# Patient Record
Sex: Male | Born: 1995
Health system: Southern US, Community
[De-identification: ages and names within clinical notes are randomized; demographics above are authoritative.]

## PROBLEM LIST (undated history)

## (undated) DIAGNOSIS — J45909 Unspecified asthma, uncomplicated: Secondary | ICD-10-CM

## (undated) HISTORY — DX: Unspecified asthma, uncomplicated: J45.909

## (undated) HISTORY — PX: TONSILLECTOMY: SUR1361

---

## 2017-09-26 ENCOUNTER — Other Ambulatory Visit: Payer: Self-pay

## 2017-09-26 ENCOUNTER — Encounter (HOSPITAL_BASED_OUTPATIENT_CLINIC_OR_DEPARTMENT_OTHER): Payer: Self-pay | Admitting: *Deleted

## 2017-09-26 ENCOUNTER — Emergency Department (HOSPITAL_BASED_OUTPATIENT_CLINIC_OR_DEPARTMENT_OTHER): Payer: Self-pay

## 2017-09-26 DIAGNOSIS — M545 Low back pain: Secondary | ICD-10-CM | POA: Insufficient documentation

## 2017-09-26 DIAGNOSIS — R05 Cough: Secondary | ICD-10-CM | POA: Insufficient documentation

## 2017-09-26 NOTE — ED Triage Notes (Signed)
Cough x 3 days

## 2017-09-27 ENCOUNTER — Emergency Department (HOSPITAL_BASED_OUTPATIENT_CLINIC_OR_DEPARTMENT_OTHER)
Admission: EM | Admit: 2017-09-27 | Discharge: 2017-09-27 | Disposition: A | Discharge status: Home / Self Care | Payer: Self-pay | Attending: Emergency Medicine | Admitting: Emergency Medicine

## 2017-09-27 DIAGNOSIS — R059 Cough, unspecified: Secondary | ICD-10-CM

## 2017-09-27 DIAGNOSIS — R05 Cough: Secondary | ICD-10-CM

## 2017-09-27 NOTE — ED Provider Notes (Signed)
[facility] EMERGENCY DEPARTMENT Provider Note   CSN: [id number] Arrival date & time: 09/26/17  2157     History   Chief Complaint Chief Complaint  Patient presents with  . Cough    HPI Neil Mccarty is a 22 y.o. male.  The history is provided by the patient.  Cough  This is a new problem. The current episode started 2 days ago. The problem occurs hourly. The problem has not changed since onset.The cough is non-productive. There has been no fever. Pertinent negatives include no shortness of breath. He has tried nothing for the symptoms.  Patient reports of the past 2 days has been having cough. No fever, no vomiting, no shortness of breath reported He reports due to the cough is not having bilateral back pain and chest soreness He has no other complaints  PMH-none Past Surgical History:  Procedure Laterality Date  . TONSILLECTOMY         Home Medications    Prior to Admission medications   Not on File    Family History No family history on file.  Social History Social History   Tobacco Use  . Smoking status: Never Smoker  . Smokeless tobacco: Never Used  Substance Use Topics  . Alcohol use: No    Frequency: Never  . Drug use: No     Allergies   Patient has no known allergies.   Review of Systems Review of Systems  Constitutional: Negative for fever.  Respiratory: Positive for cough. Negative for shortness of breath.   Gastrointestinal: Negative for vomiting.  Musculoskeletal: Positive for back pain.     Physical Exam Updated Vital Signs BP 129/68 (BP Location: Right Arm)   Pulse 84   Temp 98.3 F (36.8 C) (Oral)   Resp 17   Ht 1.803 m (5\' 11" )   Wt 107 kg (236 lb)   SpO2 99%   BMI 32.92 kg/m   Physical Exam  CONSTITUTIONAL: Well developed/well nourished, patient sleeping on arrival to room HEAD: Normocephalic/atraumatic EYES: EOMI/PERRL ENMT: Mucous membranes moist NECK: supple no meningeal  signs SPINE/BACK:entire spine nontender Mild tenderness noted to thoracic paraspinal region CV: S1/S2 noted, no murmurs/rubs/gallops noted LUNGS: Lungs are clear to auscultation bilaterally, no apparent distress ABDOMEN: soft NEURO: Pt is awake/alert/appropriate, moves all extremitiesx4.  No facial droop.   EXTREMITIES: full ROM SKIN: warm, color normal PSYCH: no abnormalities of mood noted, alert and oriented to situation  ED Treatments / Results  Labs (all labs ordered are listed, but only abnormal results are displayed) Labs Reviewed - No data to display  EKG  EKG Interpretation None       Radiology Dg Chest 2 View  Result Date: 09/26/2017 CLINICAL DATA:  Cough, left chest and back pain EXAM: CHEST  2 VIEW COMPARISON:  None. FINDINGS: Heart and mediastinal contours are within normal limits. No focal opacities or effusions. No acute bony abnormality. IMPRESSION: No active cardiopulmonary disease. Electronically Signed   By: Charlett Nose M.D.   On: 09/26/2017 22:23    Procedures Procedures (including critical care time)  Medications Ordered in ED Medications - No data to display   Initial Impression / Assessment and Plan / ED Course  I have reviewed the triage vital signs and the nursing notes.  Pertinent imaging results that were available during my care of the patient were reviewed by me and considered in my medical decision making (see chart for details).      vitals appropriate, no hypoxia.  When I arrived to the room he was sleeping without any difficulty Chest x-ray is negative Suspect his pain is due to coughing. Discharge home Final Clinical Impressions(s) / ED Diagnoses   Final diagnoses:  Cough    ED Discharge Orders    None       Zadie RhineWickline, Keala Drum, MD 09/27/17 [phone]

## 2019-01-25 ENCOUNTER — Other Ambulatory Visit: Payer: Self-pay

## 2019-01-25 ENCOUNTER — Encounter (HOSPITAL_BASED_OUTPATIENT_CLINIC_OR_DEPARTMENT_OTHER): Payer: Self-pay

## 2019-01-25 ENCOUNTER — Emergency Department (HOSPITAL_BASED_OUTPATIENT_CLINIC_OR_DEPARTMENT_OTHER)
Admission: EM | Admit: 2019-01-25 | Discharge: 2019-01-25 | Disposition: A | Discharge status: Home / Self Care | Payer: Medicaid Other | Attending: Emergency Medicine | Admitting: Emergency Medicine

## 2019-01-25 DIAGNOSIS — K0889 Other specified disorders of teeth and supporting structures: Secondary | ICD-10-CM | POA: Insufficient documentation

## 2019-01-25 MED ORDER — AMOXICILLIN 500 MG PO CAPS: 500.0000 mg | ORAL_CAPSULE | Freq: Three times a day (TID) | ORAL | 0 refills | Status: DC

## 2019-01-25 MED ORDER — NAPROXEN 375 MG PO TABS: 375.0000 mg | ORAL_TABLET | Freq: Two times a day (BID) | ORAL | 0 refills | Status: DC

## 2019-01-25 MED FILL — AMOXICILLIN 500 MG CAPSULE: 500 | 7 days supply | Qty: 21 | Fill #0

## 2019-01-25 MED FILL — NAPROXEN 375 MG TABLET: 375 | 10 days supply | Qty: 20 | Fill #0

## 2019-01-25 NOTE — ED Provider Notes (Signed)
[facility] EMERGENCY DEPARTMENT Provider Note   CSN: [id number] Arrival date & time: 01/25/19  1507    History   Chief Complaint Chief Complaint  Patient presents with  . Dental Pain    HPI Neil Mccarty is a 23 y.o. male.     Patient presents with right lower tooth pain and gum swelling onset three days ago. No fevers or chills. No difficulty swallowing.  The history is provided by the patient. No language interpreter was used.  Dental Pain  Location:  Lower Lower teeth location:  32/RL 3rd molar Quality:  Aching and throbbing Onset quality:  Gradual Duration:  3 days Timing:  Constant Chronicity:  New Ineffective treatments:  None tried Associated symptoms: gum swelling   Associated symptoms: no difficulty swallowing, no facial swelling, no fever and no trismus     History reviewed. No pertinent past medical history.  There are no active problems to display for this patient.   Past Surgical History:  Procedure Laterality Date  . TONSILLECTOMY          Home Medications    Prior to Admission medications   Not on File    Family History No family history on file.  Social History Social History   Tobacco Use  . Smoking status: Never Smoker  . Smokeless tobacco: Never Used  Substance Use Topics  . Alcohol use: No    Frequency: Never  . Drug use: No     Allergies   Patient has no known allergies.   Review of Systems Review of Systems  Constitutional: Negative for fever.  HENT: Positive for dental problem. Negative for facial swelling.   All other systems reviewed and are negative.    Physical Exam Updated Vital Signs BP 126/66 (BP Location: Right Arm)   Pulse 64   Temp 98.5 F (36.9 C) (Oral)   Resp 20   Ht 5\' 11"  (1.803 m)   Wt 113.4 kg   SpO2 97%   BMI 34.87 kg/m   Physical Exam Vitals signs and nursing note reviewed.  Constitutional:      Appearance: Normal appearance.  HENT:     Head: Atraumatic.    Jaw: No trismus.     Mouth/Throat:     Mouth: Mucous membranes are moist.      Comments: Erupting wisdom tooth.  Eyes:     Conjunctiva/sclera: Conjunctivae normal.  Neck:     Musculoskeletal: Normal range of motion.  Cardiovascular:     Rate and Rhythm: Normal rate and regular rhythm.  Pulmonary:     Effort: Pulmonary effort is normal.     Breath sounds: Normal breath sounds.  Skin:    General: Skin is warm and dry.  Neurological:     Mental Status: He is alert and oriented to person, place, and time.  Psychiatric:        Mood and Affect: Mood normal.      ED Treatments / Results  Labs (all labs ordered are listed, but only abnormal results are displayed) Labs Reviewed - No data to display  EKG None  Radiology No results found.  Procedures Procedures (including critical care time)  Medications Ordered in ED Medications - No data to display   Initial Impression / Assessment and Plan / ED Course  I have reviewed the triage vital signs and the nursing notes.  Pertinent labs & imaging results that were available during my care of the patient were reviewed by me and considered in  my medical decision making (see chart for details).        Patient with dentalgia.  No abscess requiring immediate incision and drainage.  Exam not concerning for Ludwig's angina or pharyngeal abscess.  Will treat with NSAID and amoxicillin. Pt instructed to follow-up with dentist.  Discussed return precautions. Pt safe for discharge.  Final Clinical Impressions(s) / ED Diagnoses   Final diagnoses:  Pain, dental    ED Discharge Orders         Ordered    naproxen (NAPROSYN) 375 MG tablet  2 times daily     01/25/19 1612    amoxicillin (AMOXIL) 500 MG capsule  3 times daily     01/25/19 1612           Felicie MornSmith, Raad Clayson, NP 01/25/19 1617    Vanetta MuldersZackowski, Scott, MD 01/30/19 [phone]

## 2019-01-25 NOTE — ED Triage Notes (Signed)
Pt c/o "abscess" to R side of mouth. Pt states the area has been hurting for a couple of days. Denies fever.

## 2019-04-08 ENCOUNTER — Encounter (HOSPITAL_BASED_OUTPATIENT_CLINIC_OR_DEPARTMENT_OTHER): Payer: Self-pay | Admitting: Emergency Medicine

## 2019-04-08 ENCOUNTER — Emergency Department (HOSPITAL_BASED_OUTPATIENT_CLINIC_OR_DEPARTMENT_OTHER)
Admission: EM | Admit: 2019-04-08 | Discharge: 2019-04-08 | Disposition: A | Discharge status: Home / Self Care | Payer: Medicaid Other | Attending: Emergency Medicine | Admitting: Emergency Medicine

## 2019-04-08 ENCOUNTER — Other Ambulatory Visit: Payer: Self-pay

## 2019-04-08 DIAGNOSIS — Z79899 Other long term (current) drug therapy: Secondary | ICD-10-CM | POA: Insufficient documentation

## 2019-04-08 DIAGNOSIS — K047 Periapical abscess without sinus: Secondary | ICD-10-CM | POA: Insufficient documentation

## 2019-04-08 DIAGNOSIS — K0889 Other specified disorders of teeth and supporting structures: Secondary | ICD-10-CM

## 2019-04-08 MED ORDER — ACETAMINOPHEN ER 650 MG PO TBCR: 650.0000 mg | EXTENDED_RELEASE_TABLET | Freq: Three times a day (TID) | ORAL | 0 refills | Status: DC | PRN

## 2019-04-08 MED ORDER — CLINDAMYCIN HCL 150 MG PO CAPS: 300.0000 mg | ORAL_CAPSULE | Freq: Three times a day (TID) | ORAL | 0 refills | Status: AC

## 2019-04-08 MED ORDER — CLINDAMYCIN HCL 150 MG PO CAPS: 300.0000 mg | ORAL_CAPSULE | Freq: Three times a day (TID) | ORAL | 0 refills | Status: DC

## 2019-04-08 MED ORDER — ACETAMINOPHEN ER 650 MG PO TBCR: 650.0000 mg | EXTENDED_RELEASE_TABLET | Freq: Three times a day (TID) | ORAL | 0 refills | Status: AC | PRN

## 2019-04-08 NOTE — ED Provider Notes (Signed)
[place] EMERGENCY DEPARTMENT Provider Note   CSN: [id number] Arrival date & time: 04/08/19  1227    History   Chief Complaint Chief Complaint  Patient presents with  . Dental Pain    HPI Neil Mccarty is a 23 y.o. male.     23 y.o male with no PMH presents to the ED with a chief complaint of dental pain times yesterday.  Patient reports he was seen here in June for a dental abscess, received antibiotics which improved his condition.  Reports he now feels an abscess forming on the back of his left gumline, where his wisdom teeth need to be extracted.  Patient reports he has not set up an appointment with a dentist, states he is trying to schedule this for September.  He has not tried any medication for relieving symptoms.  He denies fevers, difficulty swallowing, difficulty with mastication.  The history is provided by the patient.  Dental Pain Associated symptoms: no fever       Past Surgical History:  Procedure Laterality Date  . TONSILLECTOMY          Home Medications    Prior to Admission medications   Medication Sig Start Date End Date Taking? Authorizing Provider  acetaminophen (TYLENOL 8 HOUR) 650 MG CR tablet Take 1 tablet (650 mg total) by mouth every 8 (eight) hours as needed for up to 7 days for pain. 04/08/19 04/15/19  Janeece Fitting, PA-C  amoxicillin (AMOXIL) 500 MG capsule Take 1 capsule (500 mg total) by mouth 3 (three) times daily. 01/25/19   Etta Quill, NP  clindamycin (CLEOCIN) 150 MG capsule Take 2 capsules (300 mg total) by mouth 3 (three) times daily for 7 days. 04/08/19 04/15/19  Janeece Fitting, PA-C  naproxen (NAPROSYN) 375 MG tablet Take 1 tablet (375 mg total) by mouth 2 (two) times daily. 01/25/19   Etta Quill, NP    Family History No family history on file.  Social History Social History   Tobacco Use  . Smoking status: Never Smoker  . Smokeless tobacco: Never Used  Substance Use Topics  . Alcohol use: No    Frequency:  Never  . Drug use: No     Allergies   Patient has no known allergies.   Review of Systems Review of Systems  Constitutional: Negative for fever.  HENT: Positive for dental problem.      Physical Exam Updated Vital Signs BP 128/67 (BP Location: Left Arm)   Pulse (!) 59   Temp 98.9 F (37.2 C) (Oral)   Resp 18   Ht 5\' 11"  (1.803 m)   Wt 113.4 kg   SpO2 99%   BMI 34.87 kg/m   Physical Exam Vitals signs and nursing note reviewed.  Constitutional:      Appearance: He is well-developed.  HENT:     Head: Normocephalic and atraumatic.     Mouth/Throat:     Mouth: Mucous membranes are moist. Mucous membranes are pale.     Pharynx: Oropharynx is clear. Uvula midline. No oropharyngeal exudate or posterior oropharyngeal erythema.   Eyes:     General: No scleral icterus.    Pupils: Pupils are equal, round, and reactive to light.  Neck:     Musculoskeletal: Normal range of motion.  Cardiovascular:     Heart sounds: Normal heart sounds.  Pulmonary:     Effort: Pulmonary effort is normal.     Breath sounds: Normal breath sounds. No wheezing.  Chest:  Chest wall: No tenderness.  Abdominal:     General: Bowel sounds are normal. There is no distension.     Palpations: Abdomen is soft.     Tenderness: There is no abdominal tenderness.  Musculoskeletal:        General: No tenderness or deformity.  Skin:    General: Skin is warm and dry.  Neurological:     Mental Status: He is alert and oriented to person, place, and time.      ED Treatments / Results  Labs (all labs ordered are listed, but only abnormal results are displayed) Labs Reviewed - No data to display  EKG None  Radiology No results found.  Procedures Procedures (including critical care time)  Medications Ordered in ED Medications - No data to display   Initial Impression / Assessment and Plan / ED Course  I have reviewed the triage vital signs and the nursing notes.  Pertinent labs &  imaging results that were available during my care of the patient were reviewed by me and considered in my medical decision making (see chart for details).   Patient with no PMH presents to the ED with a complaint of dental abscess x 1 day.  Patient had a similar episode in June, was placed on antibiotics along with pain medication which resolved the issue.  Patient reports he is aware he will need to have his wisdom teeth extracted, states he has not made an appointment with dental follow-up.  Upon evaluation there is erythema surrounding the back of his wisdom teeth region, small abscesses noted to the area, no uvula swelling, uvula is midline, no exudates, no difficulty swallowing.  Patient is nontoxic-appearing, will discharge patient on a second course of antibiotics along with pain medication.  He is aware he will do any is to follow-up with dentist in order to help with some teeth removed.  He reports he will be scheduling an appointment in September.  With otherwise stable vital signs, afebrile patient stable for discharge.  Portions of this note were generated with Scientist, clinical (histocompatibility and immunogenetics)Dragon dictation software. Dictation errors may occur despite best attempts at proofreading.    Final Clinical Impressions(s) / ED Diagnoses   Final diagnoses:  Pain, dental  Dental abscess    ED Discharge Orders         Ordered    clindamycin (CLEOCIN) 150 MG capsule  3 times daily     04/08/19 1411    acetaminophen (TYLENOL 8 HOUR) 650 MG CR tablet  Every 8 hours PRN     04/08/19 1412           Claude MangesSoto, Finnian Husted, PA-C 04/08/19 1418    Palumbo, April, MD 04/08/19 1419

## 2019-04-08 NOTE — ED Triage Notes (Signed)
L lower dental pain x 4 days.

## 2019-04-08 NOTE — Discharge Instructions (Addendum)
I have prescribed antibiotics to treat your dental infection, you will need to complete the course of antibiotics and setup and appointment with a dentist.   Dental resources are also attached to your chart, please schedule an appointment.

## 2019-04-09 MED FILL — CLINDAMYCIN HCL 150 MG CAPS: 150 | 7 days supply | Qty: 42 | Fill #0

## 2019-04-13 ENCOUNTER — Emergency Department (HOSPITAL_BASED_OUTPATIENT_CLINIC_OR_DEPARTMENT_OTHER)
Admission: EM | Admit: 2019-04-13 | Discharge: 2019-04-13 | Disposition: A | Discharge status: Home / Self Care | Payer: Medicaid Other | Attending: Emergency Medicine | Admitting: Emergency Medicine

## 2019-04-13 ENCOUNTER — Encounter (HOSPITAL_BASED_OUTPATIENT_CLINIC_OR_DEPARTMENT_OTHER): Payer: Self-pay

## 2019-04-13 ENCOUNTER — Other Ambulatory Visit: Payer: Self-pay

## 2019-04-13 DIAGNOSIS — K122 Cellulitis and abscess of mouth: Secondary | ICD-10-CM | POA: Insufficient documentation

## 2019-04-13 DIAGNOSIS — Z79899 Other long term (current) drug therapy: Secondary | ICD-10-CM | POA: Insufficient documentation

## 2019-04-13 DIAGNOSIS — K121 Other forms of stomatitis: Secondary | ICD-10-CM

## 2019-04-13 MED ORDER — LIDOCAINE VISCOUS HCL 2 % MT SOLN: 15.0000 mL | OROMUCOSAL | 0 refills | Status: DC | PRN

## 2019-04-13 MED FILL — LIDOCAINE 2% VISCOUS SOLN: 2 | 2 days supply | Qty: 100 | Fill #0

## 2019-04-13 NOTE — ED Provider Notes (Signed)
[place] EMERGENCY DEPARTMENT Provider Note   CSN: [id number] Arrival date & time: 04/13/19  1200    History   Chief Complaint Chief Complaint  Patient presents with  . Sore Throat    HPI Neil Mccarty is a 23 y.o. male.     The history is provided by the patient and medical records. No language interpreter was used.  Sore Throat Pertinent negatives include no chest pain, no abdominal pain and no shortness of breath.   Neil Mccarty is an otherwise healthy 23 y.o. male who presents to the Emergency Department complaining of left-sided sore throat which began yesterday.  He states that he has had a dry cough and runny nose for the last 4 to 5 days.  He states that he was not too concerned about this as it happens this time of the year.  He developed a little bit of a sore throat yesterday and started gargling peroxide.  Last night, the left side of his throat began to hurt very badly.  He looked in his mouth and noticed that the left side of his palate was red and looked like there was a spot on it.  Have pain when he woke this morning, prompting him to come to the emergency department today.  He denies any fever or chills.  No known coronavirus exposures.  Does not want testing today.  No shortness of breath or chest pain.  No fevers.  History reviewed. No pertinent past medical history.  There are no active problems to display for this patient.   Past Surgical History:  Procedure Laterality Date  . TONSILLECTOMY          Home Medications    Prior to Admission medications   Medication Sig Start Date End Date Taking? Authorizing Provider  acetaminophen (TYLENOL 8 HOUR) 650 MG CR tablet Take 1 tablet (650 mg total) by mouth every 8 (eight) hours as needed for up to 7 days for pain. 04/08/19 04/15/19  Janeece Fitting, PA-C  amoxicillin (AMOXIL) 500 MG capsule Take 1 capsule (500 mg total) by mouth 3 (three) times daily. 01/25/19   Etta Quill, NP   clindamycin (CLEOCIN) 150 MG capsule Take 2 capsules (300 mg total) by mouth 3 (three) times daily for 7 days. 04/08/19 04/15/19  Janeece Fitting, PA-C  lidocaine (XYLOCAINE) 2 % solution Use as directed 15 mLs in the mouth or throat every 4 (four) hours as needed for mouth pain. 04/13/19   Ward, Ozella Almond, PA-C  naproxen (NAPROSYN) 375 MG tablet Take 1 tablet (375 mg total) by mouth 2 (two) times daily. 01/25/19   Etta Quill, NP    Family History No family history on file.  Social History Social History   Tobacco Use  . Smoking status: Never Smoker  . Smokeless tobacco: Never Used  Substance Use Topics  . Alcohol use: No    Frequency: Never  . Drug use: No     Allergies   Patient has no known allergies.   Review of Systems Review of Systems  Constitutional: Negative for chills and fever.  HENT: Positive for sore throat. Negative for congestion, trouble swallowing and voice change.   Respiratory: Positive for cough. Negative for shortness of breath.   Cardiovascular: Negative for chest pain.  Gastrointestinal: Negative for abdominal pain, nausea and vomiting.  Musculoskeletal: Negative for arthralgias and myalgias.     Physical Exam Updated Vital Signs BP 134/79 (BP Location: Left Arm)   Pulse 68  Temp 98.8 F (37.1 C) (Oral)   Resp 18   Ht 5\' 11"  (1.803 m)   Wt 125.2 kg   SpO2 98%   BMI 38.49 kg/m   Physical Exam Vitals signs and nursing note reviewed.  Constitutional:      General: He is not in acute distress.    Appearance: He is well-developed.  HENT:     Head: Normocephalic and atraumatic.     Mouth/Throat:   Neck:     Musculoskeletal: Neck supple.  Cardiovascular:     Rate and Rhythm: Normal rate and regular rhythm.     Heart sounds: Normal heart sounds. No murmur.  Pulmonary:     Effort: Pulmonary effort is normal. No respiratory distress.     Breath sounds: Normal breath sounds.  Skin:    General: Skin is warm and dry.  Neurological:      Mental Status: He is alert and oriented to person, place, and time.      ED Treatments / Results  Labs (all labs ordered are listed, but only abnormal results are displayed) Labs Reviewed - No data to display  EKG None  Radiology No results found.  Procedures Procedures (including critical care time)  Medications Ordered in ED Medications - No data to display   Initial Impression / Assessment and Plan / ED Course  I have reviewed the triage vital signs and the nursing notes.  Pertinent labs & imaging results that were available during my care of the patient were reviewed by me and considered in my medical decision making (see chart for details).       Neil Mccarty is a 23 y.o. male who presents to ED for dry cough and nasal congestion for several days with sore throat developing yesterday, mostly to the left.  On exam, he does have an ulcer to this area which appears to be causing his sore throat.  Doubt bacterial pharyngitis.  Lungs are clear to auscultation bilaterally.  Do not feel he needs imaging of his chest.  Offered coronavirus testing given mild URI symptoms, however he declines.  Will treat symptomatically with viscous lidocaine and encourage salt water gargles after all meals.  PCP follow-up if no improvement.  Reasons to return to the emergency department were discussed and all questions answered.  Patient discussed with Dr. Jacqulyn BathLong who agrees with treatment plan.    Final Clinical Impressions(s) / ED Diagnoses   Final diagnoses:  Ulcer of mouth    ED Discharge Orders         Ordered    lidocaine (XYLOCAINE) 2 % solution  Every 4 hours PRN     04/13/19 1328           Ward, Chase PicketJaime Pilcher, PA-C 04/13/19 1334    Maia PlanLong, Joshua G, MD 04/14/19 1254
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
## 2019-04-13 NOTE — Discharge Instructions (Signed)
It was my pleasure taking care of you today!   Use gets lidocaine as needed for throat pain.  Gargle salt water after all meals.  This should get better over the next several days.  If you are still having any problems in 1 week, see your primary care doctor.  Return to ER for new or worsening symptoms, any additional concerns.

## 2019-07-13 ENCOUNTER — Other Ambulatory Visit: Payer: Self-pay

## 2019-07-13 ENCOUNTER — Encounter (HOSPITAL_BASED_OUTPATIENT_CLINIC_OR_DEPARTMENT_OTHER): Payer: Self-pay

## 2019-07-13 ENCOUNTER — Emergency Department (HOSPITAL_BASED_OUTPATIENT_CLINIC_OR_DEPARTMENT_OTHER): Payer: No Typology Code available for payment source

## 2019-07-13 ENCOUNTER — Emergency Department (HOSPITAL_BASED_OUTPATIENT_CLINIC_OR_DEPARTMENT_OTHER)
Admission: EM | Admit: 2019-07-13 | Discharge: 2019-07-13 | Disposition: A | Discharge status: Home / Self Care | Payer: No Typology Code available for payment source | Attending: Emergency Medicine | Admitting: Emergency Medicine

## 2019-07-13 DIAGNOSIS — M545 Low back pain: Secondary | ICD-10-CM | POA: Diagnosis not present

## 2019-07-13 DIAGNOSIS — R0789 Other chest pain: Secondary | ICD-10-CM | POA: Insufficient documentation

## 2019-07-13 DIAGNOSIS — R519 Headache, unspecified: Secondary | ICD-10-CM | POA: Insufficient documentation

## 2019-07-13 DIAGNOSIS — M542 Cervicalgia: Secondary | ICD-10-CM | POA: Diagnosis not present

## 2019-07-13 DIAGNOSIS — M25532 Pain in left wrist: Secondary | ICD-10-CM | POA: Insufficient documentation

## 2019-07-13 DIAGNOSIS — M7918 Myalgia, other site: Secondary | ICD-10-CM

## 2019-07-13 DIAGNOSIS — Z791 Long term (current) use of non-steroidal anti-inflammatories (NSAID): Secondary | ICD-10-CM | POA: Diagnosis not present

## 2019-07-13 DIAGNOSIS — M25562 Pain in left knee: Secondary | ICD-10-CM | POA: Insufficient documentation

## 2019-07-13 MED ORDER — METHOCARBAMOL 500 MG PO TABS: 1000.0000 mg | ORAL_TABLET | Freq: Four times a day (QID) | ORAL | 0 refills | Status: DC

## 2019-07-13 MED ORDER — NAPROXEN 500 MG PO TABS: 500.0000 mg | ORAL_TABLET | Freq: Two times a day (BID) | ORAL | 0 refills | Status: DC

## 2019-07-13 MED ORDER — NAPROXEN 250 MG PO TABS
500.0000 mg | ORAL_TABLET | Freq: Once | ORAL | Status: AC
  Administered 2019-07-13: 500 mg via ORAL
  Filled 2019-07-13: qty 2

## 2019-07-13 MED ORDER — METHOCARBAMOL 500 MG PO TABS
1000.0000 mg | ORAL_TABLET | Freq: Once | ORAL | Status: AC
  Administered 2019-07-13: 1000 mg via ORAL
  Filled 2019-07-13: qty 2
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
## 2019-07-13 NOTE — ED Notes (Signed)
Patient transported to X-ray 
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
## 2019-07-13 NOTE — ED Notes (Signed)
ED Provider at bedside. 

## 2019-07-16 MED FILL — NAPROXEN 500 MG TABS: 500 | 15 days supply | Qty: 30 | Fill #0

## 2019-07-16 MED FILL — METHOCARBAMOL 500 MG TABLET: 500 | 3 days supply | Qty: 20 | Fill #0

## 2019-10-08 ENCOUNTER — Emergency Department (HOSPITAL_BASED_OUTPATIENT_CLINIC_OR_DEPARTMENT_OTHER)
Admission: EM | Admit: 2019-10-08 | Discharge: 2019-10-08 | Disposition: A | Discharge status: Home / Self Care | Payer: Medicaid Other | Attending: Emergency Medicine | Admitting: Emergency Medicine

## 2019-10-08 ENCOUNTER — Encounter (HOSPITAL_BASED_OUTPATIENT_CLINIC_OR_DEPARTMENT_OTHER): Payer: Self-pay | Admitting: *Deleted

## 2019-10-08 DIAGNOSIS — L02211 Cutaneous abscess of abdominal wall: Secondary | ICD-10-CM | POA: Insufficient documentation

## 2019-10-08 LAB — CBG MONITORING, ED: Glucose-Capillary: 115 mg/dL — ABNORMAL HIGH (ref 70–99)

## 2019-10-08 MED ORDER — LIDOCAINE-EPINEPHRINE (PF) 2 %-1:200000 IJ SOLN
20.0000 mL | Freq: Once | INTRAMUSCULAR | Status: AC
  Administered 2019-10-08: 20 mL
  Filled 2019-10-08: qty 20

## 2019-10-08 MED ORDER — SULFAMETHOXAZOLE-TRIMETHOPRIM 800-160 MG PO TABS: 1.0000 | ORAL_TABLET | Freq: Two times a day (BID) | ORAL | 0 refills | Status: AC

## 2019-10-08 MED ORDER — SULFAMETHOXAZOLE-TRIMETHOPRIM 800-160 MG PO TABS
1.0000 | ORAL_TABLET | Freq: Once | ORAL | Status: AC
  Administered 2019-10-08: 1 via ORAL
  Filled 2019-10-08: qty 1

## 2019-10-08 NOTE — Discharge Instructions (Signed)
Please read and follow all provided instructions.  Your diagnoses today include:  1. Abdominal wall abscess     Tests performed today include:  Vital signs. See below for your results today.   Medications prescribed:   Bactrim (trimethoprim/sulfamethoxazole) - antibiotic  You have been prescribed an antibiotic medicine: take the entire course of medicine even if you are feeling better. Stopping early can cause the antibiotic not to work.  Take any prescribed medications only as directed.   Home care instructions:   Follow any educational materials contained in this packet  Follow-up instructions: Return to the Emergency Department in 48 hours for a recheck if your symptoms are not significantly improved.  Please follow-up with your primary care provider in the next 1 week for further evaluation of your symptoms.   Return instructions:  Return to the Emergency Department if you have:  Fever  Worsening symptoms  Worsening pain  Worsening swelling  Redness of the skin that moves away from the affected area, especially if it streaks away from the affected area   Any other emergent concerns  Your vital signs today were: BP (!) 144/87   Pulse 62   Temp 99.2 F (37.3 C) (Oral)   Resp 20   Ht 5\' 11"  (1.803 m)   Wt 113.4 kg   SpO2 99%   BMI 34.87 kg/m  If your blood pressure (BP) was elevated above 135/85 this visit, please have this repeated by your doctor within one month. --------------

## 2019-10-08 NOTE — ED Notes (Signed)
ED Provider at bedside. 

## 2019-10-08 NOTE — ED Triage Notes (Signed)
Abscess x 2 to his abdomen.

## 2019-10-08 NOTE — ED Provider Notes (Signed)
[facility] EMERGENCY DEPARTMENT Provider Note   CSN: [id number] Arrival date & time: 10/08/19  2030     History Chief Complaint  Patient presents with  . Abscess    Neil Mccarty is a 24 y.o. male.  Patient presents to the emergency department with complaint of abdominal pain and abscess.  Patient has developed 2 areas on his mid abdomen that have become progressively more swollen and painful.  He has has had some redness of the skin.  No fevers, nausea or vomiting.  No history of diabetes.  No treatments prior to arrival.        History reviewed. No pertinent past medical history.  There are no problems to display for this patient.   Past Surgical History:  Procedure Laterality Date  . TONSILLECTOMY         No family history on file.  Social History   Tobacco Use  . Smoking status: Never Smoker  . Smokeless tobacco: Never Used  Substance Use Topics  . Alcohol use: No  . Drug use: No    Home Medications Prior to Admission medications   Medication Sig Start Date End Date Taking? Authorizing Provider  sulfamethoxazole-trimethoprim (BACTRIM DS) 800-160 MG tablet Take 1 tablet by mouth 2 (two) times daily for 7 days. 10/08/19 10/15/19  Renne Crigler, PA-C    Allergies    Patient has no known allergies.  Review of Systems   Review of Systems  Constitutional: Negative for fever.  Gastrointestinal: Negative for nausea and vomiting.  Skin: Positive for color change.       Positive for abscess  Hematological: Negative for adenopathy.    Physical Exam Updated Vital Signs BP (!) 144/87   Pulse 62   Temp 99.2 F (37.3 C) (Oral)   Resp 20   Ht 5\' 11"  (1.803 m)   Wt 113.4 kg   SpO2 99%   BMI 34.87 kg/m   Physical Exam Vitals and nursing note reviewed.  Constitutional:      Appearance: He is well-developed.  HENT:     Head: Normocephalic and atraumatic.  Eyes:     Conjunctiva/sclera: Conjunctivae normal.  Pulmonary:     Effort: No  respiratory distress.  Musculoskeletal:     Cervical back: Normal range of motion and neck supple.  Skin:    General: Skin is warm and dry.     Comments: Upper abdominal wall: 2 cm area of induration with some active purulent drainage.  Mild overlying erythema.  Mid abdominal wall: 3 to 4 cm area of induration with no drainage.  Mild overlying erythema with extension several centimeters from the area of induration consistent with cellulitis.    Both areas are tender.    Neurological:     Mental Status: He is alert.     ED Results / Procedures / Treatments   Labs (all labs ordered are listed, but only abnormal results are displayed) Labs Reviewed  CBG MONITORING, ED - Abnormal; Notable for the following components:      Result Value   Glucose-Capillary 115 (*)    All other components within normal limits    EKG None  Radiology No results found.  Procedures . Incision and Drainage  Date/Time: 10/08/2019 10:01 PM Performed by: 12/06/2019, PA-C Authorized by: Renne Crigler, PA-C   Consent:    Consent obtained:  Verbal   Consent given by:  Patient   Risks discussed:  Bleeding, incomplete drainage, pain and infection   Alternatives discussed:  No treatment Location:    Type:  Abscess   Location:  Trunk   Trunk location:  Abdomen Pre-procedure details:    Skin preparation:  Betadine Anesthesia (see MAR for exact dosages):    Anesthesia method:  Local infiltration   Local anesthetic:  Lidocaine 2% WITH epi Procedure type:    Complexity:  Simple Procedure details:    Incision types:  Stab incision   Scalpel blade:  11   Wound management:  Probed and deloculated   Drainage:  Purulent   Drainage amount:  Moderate   Wound treatment:  Wound left open   Packing materials:  None Post-procedure details:    Patient tolerance of procedure:  Tolerated well, no immediate complications .Marland KitchenIncision and Drainage  Date/Time: 10/08/2019 10:02 PM Performed by: Carlisle Cater, PA-C Authorized by: Carlisle Cater, PA-C   Consent:    Consent obtained:  Verbal   Consent given by:  Patient   Risks discussed:  Bleeding, incomplete drainage, pain and infection   Alternatives discussed:  No treatment Location:    Type:  Abscess   Location:  Trunk   Trunk location:  Abdomen Pre-procedure details:    Skin preparation:  Betadine Anesthesia (see MAR for exact dosages):    Anesthesia method:  Local infiltration   Local anesthetic:  Lidocaine 2% WITH epi Procedure type:    Complexity:  Simple Procedure details:    Incision types:  Stab incision   Scalpel blade:  11   Wound management:  Probed and deloculated   Drainage:  Purulent   Drainage amount:  Scant   Packing materials:  None Post-procedure details:    Patient tolerance of procedure:  Tolerated well, no immediate complications   (including critical care time)  Medications Ordered in ED Medications  lidocaine-EPINEPHrine (XYLOCAINE W/EPI) 2 %-1:200000 (PF) injection 20 mL (20 mLs Infiltration Given 10/08/19 2105)  sulfamethoxazole-trimethoprim (BACTRIM DS) 800-160 MG per tablet 1 tablet (1 tablet Oral Given 10/08/19 2110)    ED Course  I have reviewed the triage vital signs and the nursing notes.  Pertinent labs & imaging results that were available during my care of the patient were reviewed by me and considered in my medical decision making (see chart for details).  Patient seen and examined.   Vital signs reviewed and are as follows: BP (!) 144/87   Pulse 62   Temp 99.2 F (37.3 C) (Oral)   Resp 20   Ht 5\' 11"  (1.803 m)   Wt 113.4 kg   SpO2 99%   BMI 34.87 kg/m   CBG 115. I&D as above. Rx Bactrim, first dose given in the ED.    MDM Rules/Calculators/A&P                      Patient presents with 2 distinct abdominal wall abscesses, likely infected sebaceous cysts.  I&D as above.  No systemic symptoms of illness.     Final Clinical Impression(s) / ED Diagnoses Final  diagnoses:  Abdominal wall abscess    Rx / DC Orders ED Discharge Orders         Ordered    sulfamethoxazole-trimethoprim (BACTRIM DS) 800-160 MG tablet  2 times daily     10/08/19 2156           Carlisle Cater, PA-C 10/08/19 2205    Fredia Sorrow, MD 10/08/19 2211

## 2019-11-01 ENCOUNTER — Other Ambulatory Visit: Payer: Self-pay

## 2019-11-01 ENCOUNTER — Encounter (HOSPITAL_BASED_OUTPATIENT_CLINIC_OR_DEPARTMENT_OTHER): Payer: Self-pay | Admitting: *Deleted

## 2019-11-01 ENCOUNTER — Emergency Department (HOSPITAL_BASED_OUTPATIENT_CLINIC_OR_DEPARTMENT_OTHER)
Admission: EM | Admit: 2019-11-01 | Discharge: 2019-11-02 | Disposition: A | Discharge status: Home / Self Care | Payer: Medicaid Other | Attending: Emergency Medicine | Admitting: Emergency Medicine

## 2019-11-01 DIAGNOSIS — L03115 Cellulitis of right lower limb: Secondary | ICD-10-CM | POA: Insufficient documentation

## 2019-11-01 DIAGNOSIS — R0989 Other specified symptoms and signs involving the circulatory and respiratory systems: Secondary | ICD-10-CM

## 2019-11-01 NOTE — ED Triage Notes (Signed)
C/o area of redness and swelling to the right lower leg since Sunday. Says has had some drainage from the area. No meds PTA.

## 2019-11-01 NOTE — ED Provider Notes (Signed)
   [facility] Provider Note: Lowella Dell, MD, FACEP  CSN: [id number] MRN: [id number] ARRIVAL: 11/01/19 at 2341 ROOM: MH02/[facility]02   CHIEF COMPLAINT  Leg Pain   HISTORY OF PRESENT ILLNESS  11/01/19 11:55 PM Neil Mccarty is a 24 y.o. male with a 5-day history of pain and swelling in his right lower leg.  This started at the site of a "pimple".  There is an area of erythema now surrounding that pimple.  He rates the pain as an 8 out of 10, worse with palpation or ambulation.  He denies shortness of breath.  He denies fever or chills.   History reviewed. No pertinent past medical history.  Past Surgical History:  Procedure Laterality Date  . TONSILLECTOMY      No family history on file.  Social History   Tobacco Use  . Smoking status: Never Smoker  . Smokeless tobacco: Never Used  Substance Use Topics  . Alcohol use: No  . Drug use: No    Prior to Admission medications   Not on File    Allergies Patient has no known allergies.   REVIEW OF SYSTEMS  Negative except as noted here or in the History of Present Illness.   PHYSICAL EXAMINATION  Initial Vital Signs Blood pressure (!) 147/96, pulse 77, temperature 98.1 F (36.7 C), temperature source Oral, resp. rate 16, height 5\' 11"  (1.803 m), weight 113.4 kg, SpO2 100 %.  Examination General: Well-developed, well-nourished male in no acute distress; appearance consistent with age of record HENT: normocephalic; atraumatic Eyes: Normal appearance Neck: supple Heart: regular rate and rhythm Lungs: clear to auscultation bilaterally Abdomen: soft; nondistended; nontender; bowel sounds present Extremities: No deformity; full range of motion; pulses normal; swelling of right lower leg with erythema and tenderness centered around a small lesion:    Neurologic: Awake, alert and oriented; motor function intact in all extremities and symmetric; no facial droop Skin: Warm and dry Psychiatric: Normal mood  and affect   RESULTS  Summary of this visit's results, reviewed and interpreted by myself:   EKG Interpretation  Date/Time:    Ventricular Rate:    PR Interval:    QRS Duration:   QT Interval:    QTC Calculation:   R Axis:     Text Interpretation:        Laboratory Studies: No results found for this or any previous visit (from the past 24 hour(s)). Imaging Studies: No results found.  ED COURSE and MDM  Nursing notes, initial and subsequent vitals signs, including pulse oximetry, reviewed and interpreted by myself.  Vitals:   11/01/19 2347  BP: (!) 147/96  Pulse: 77  Resp: 16  Temp: 98.1 F (36.7 C)  TempSrc: Oral  SpO2: 100%  Weight: 113.4 kg  Height: 5\' 11"  (1.803 m)   Medications  doxycycline (VIBRA-TABS) tablet 100 mg (has no administration in time range)  Rivaroxaban (XARELTO) tablet 15 mg (has no administration in time range)    We will start on doxycycline for possible MRSA cellulitis.  We will have him return later this morning for Doppler ultrasound to evaluate for DVT.  PROCEDURES  Procedures   ED DIAGNOSES     ICD-10-CM   1. Cellulitis of right lower leg  L03.115   2. Suspected DVT (deep vein thrombosis)  R09.89        , MD 11/02/19 0001

## 2019-11-02 ENCOUNTER — Ambulatory Visit (HOSPITAL_BASED_OUTPATIENT_CLINIC_OR_DEPARTMENT_OTHER)
Admission: RE | Admit: 2019-11-02 | Discharge: 2019-11-02 | Disposition: A | Discharge status: Home / Self Care | Payer: Self-pay | Source: Ambulatory Visit | Attending: Emergency Medicine | Admitting: Emergency Medicine

## 2019-11-02 DIAGNOSIS — M7989 Other specified soft tissue disorders: Secondary | ICD-10-CM | POA: Insufficient documentation

## 2019-11-02 DIAGNOSIS — R590 Localized enlarged lymph nodes: Secondary | ICD-10-CM | POA: Insufficient documentation

## 2019-11-02 MED ORDER — RIVAROXABAN 15 MG PO TABS
15.0000 mg | ORAL_TABLET | Freq: Once | ORAL | Status: AC
  Administered 2019-11-02: 15 mg via ORAL
  Filled 2019-11-02: qty 1

## 2019-11-02 MED ORDER — DOXYCYCLINE HYCLATE 100 MG PO CAPS: 100.0000 mg | ORAL_CAPSULE | Freq: Two times a day (BID) | ORAL | 0 refills | Status: DC

## 2019-11-02 MED ORDER — DOXYCYCLINE HYCLATE 100 MG PO TABS
100.0000 mg | ORAL_TABLET | Freq: Once | ORAL | Status: AC
  Administered 2019-11-02: 100 mg via ORAL
  Filled 2019-11-02: qty 1

## 2020-01-06 ENCOUNTER — Encounter (HOSPITAL_BASED_OUTPATIENT_CLINIC_OR_DEPARTMENT_OTHER): Payer: Self-pay

## 2020-01-06 ENCOUNTER — Emergency Department (HOSPITAL_BASED_OUTPATIENT_CLINIC_OR_DEPARTMENT_OTHER)
Admission: EM | Admit: 2020-01-06 | Discharge: 2020-01-07 | Disposition: A | Discharge status: Home / Self Care | Payer: Medicaid Other | Attending: Emergency Medicine | Admitting: Emergency Medicine

## 2020-01-06 ENCOUNTER — Other Ambulatory Visit: Payer: Self-pay

## 2020-01-06 DIAGNOSIS — R300 Dysuria: Secondary | ICD-10-CM | POA: Insufficient documentation

## 2020-01-06 DIAGNOSIS — Z711 Person with feared health complaint in whom no diagnosis is made: Secondary | ICD-10-CM

## 2020-01-06 DIAGNOSIS — Z113 Encounter for screening for infections with a predominantly sexual mode of transmission: Secondary | ICD-10-CM | POA: Insufficient documentation

## 2020-01-06 LAB — URINALYSIS, MICROSCOPIC (REFLEX): Squamous Epithelial / LPF: NONE SEEN (ref 0–5)

## 2020-01-06 LAB — URINALYSIS, ROUTINE W REFLEX MICROSCOPIC
Bilirubin Urine: NEGATIVE
Glucose, UA: NEGATIVE mg/dL
Ketones, ur: NEGATIVE mg/dL
Leukocytes,Ua: NEGATIVE
Nitrite: NEGATIVE
Protein, ur: NEGATIVE mg/dL
Specific Gravity, Urine: >1.03 — ABNORMAL HIGH (ref 1.005–1.030)
pH: 6 (ref 5.0–8.0)

## 2020-01-06 MED ORDER — DOXYCYCLINE HYCLATE 100 MG PO TABS
100.0000 mg | ORAL_TABLET | Freq: Once | ORAL | Status: AC
  Administered 2020-01-07: 100 mg via ORAL
  Filled 2020-01-06: qty 1

## 2020-01-06 MED ORDER — CEFTRIAXONE SODIUM 500 MG IJ SOLR
500.0000 mg | Freq: Once | INTRAMUSCULAR | Status: AC
  Administered 2020-01-07: 500 mg via INTRAMUSCULAR
  Filled 2020-01-06: qty 500

## 2020-01-06 NOTE — ED Triage Notes (Signed)
Pt c/o LUQ abd pain during urination. Pt also c/o tingling to his penis. Denies penile discharge.

## 2020-01-07 MED ORDER — LIDOCAINE HCL (PF) 1 % IJ SOLN
INTRAMUSCULAR | Status: AC
  Administered 2020-01-07: 1 mL
  Filled 2020-01-07: qty 5

## 2020-01-07 MED ORDER — DOXYCYCLINE HYCLATE 100 MG PO CAPS: 100.0000 mg | ORAL_CAPSULE | Freq: Two times a day (BID) | ORAL | 0 refills | Status: DC

## 2020-01-07 NOTE — Discharge Instructions (Signed)
You were seen today for tingling in your penis.  You were tested and treated for STDs.  You need to abstain from sexual activity for the next 10 days.  You should always use a condom.  There is no evidence at this time of a urinary tract infection.

## 2020-01-07 NOTE — ED Provider Notes (Signed)
[facility] EMERGENCY DEPARTMENT Provider Note   CSN: [id number] Arrival date & time: 01/06/20  2251     History Chief Complaint  Patient presents with  . Abdominal Pain    Neil Mccarty. Neil Mccarty is a 24 y.o. male.  HPI     This a 24 year old male who presents with tingling in his penis.  Patient reports that he has noted tingling both when he urinates and when he does not.  He feels that he may have a UTI.  No history of UTIs.  He is sexually active but denies any new sexual partners.  He does not consistently wear condoms.  He has not noted any penile discharge or lesions.  No fevers or back pain.  Reported abdominal pain to triage but denies this to me.  History reviewed. No pertinent past medical history.  There are no problems to display for this patient.   Past Surgical History:  Procedure Laterality Date  . TONSILLECTOMY         No family history on file.  Social History   Tobacco Use  . Smoking status: Never Smoker  . Smokeless tobacco: Never Used  Substance Use Topics  . Alcohol use: No  . Drug use: No    Home Medications Prior to Admission medications   Medication Sig Start Date End Date Taking? Authorizing Provider  doxycycline (VIBRAMYCIN) 100 MG capsule Take 1 capsule (100 mg total) by mouth 2 (two) times daily. 01/07/20   Marin Milley, Mayer Masker, MD    Allergies    Patient has no known allergies.  Review of Systems   Review of Systems  Constitutional: Negative for fever.  Gastrointestinal: Negative for abdominal pain, nausea and vomiting.  Genitourinary: Positive for penile pain. Negative for difficulty urinating, discharge, dysuria and penile swelling.  All other systems reviewed and are negative.   Physical Exam Updated Vital Signs BP 134/79 (BP Location: Left Arm)   Pulse 71   Temp 98.1 F (36.7 C) (Oral)   Resp 20   Ht 1.803 m (5\' 11" )   Wt 113.4 kg   SpO2 96%   BMI 34.87 kg/m   Physical Exam Vitals and nursing note  reviewed.  Constitutional:      Appearance: He is well-developed. He is obese. He is not ill-appearing.  HENT:     Head: Normocephalic and atraumatic.  Eyes:     Pupils: Pupils are equal, round, and reactive to light.  Cardiovascular:     Rate and Rhythm: Normal rate and regular rhythm.     Heart sounds: Normal heart sounds. No murmur.  Pulmonary:     Effort: Pulmonary effort is normal. No respiratory distress.     Breath sounds: Normal breath sounds. No wheezing.  Abdominal:     Palpations: Abdomen is soft.     Tenderness: There is no abdominal tenderness.  Musculoskeletal:     Cervical back: Neck supple.  Lymphadenopathy:     Cervical: No cervical adenopathy.  Skin:    General: Skin is warm and dry.     Coloration: Pallor:    Neurological:     Mental Status: He is alert and oriented to person, place, and time.  Psychiatric:        Mood and Affect: Mood normal.     ED Results / Procedures / Treatments   Labs (all labs ordered are listed, but only abnormal results are displayed) Labs Reviewed  URINALYSIS, ROUTINE W REFLEX MICROSCOPIC - Abnormal; Notable for the following components:  Result Value   APPearance HAZY (*)    Specific Gravity, Urine >1.030 (*)    Hgb urine dipstick TRACE (*)    All other components within normal limits  URINALYSIS, MICROSCOPIC (REFLEX) - Abnormal; Notable for the following components:   Bacteria, UA FEW (*)    All other components within normal limits  GC/CHLAMYDIA PROBE AMP ([place]) NOT AT [facility]    EKG None  Radiology No results found.  Procedures Procedures (including critical care time)  Medications Ordered in ED Medications  cefTRIAXone (ROCEPHIN) injection 500 mg (has no administration in time range)  lidocaine (PF) (XYLOCAINE) 1 % injection (has no administration in time range)  doxycycline (VIBRA-TABS) tablet 100 mg (100 mg Oral Given 01/07/20 0020)    ED Course  I have reviewed the triage vital signs and the  nursing notes.  Pertinent labs & imaging results that were available during my care of the patient were reviewed by me and considered in my medical decision making (see chart for details).    MDM Rules/Calculators/A&P                      Patient presents with tingling in his penis.  He is overall nontoxic and vital signs reassuring.  Symptoms are both with urination and without.  No history of UTIs.  In a young otherwise healthy male, would have higher suspicion for STDs although he does not report any discharge.  He does have high risk behaviors including inconsistent condom use.  Discussed testing and treating for STDs empirically.  Urinalysis shows 0-5 white cells and few bacteria.  Doubt UTI.  GC and Chlamydia testing sent.  Rocephin and doxycycline were ordered for the patient.  He declined Rocephin.  Discussed with him safe sex practices and abstaining from sex for the next 10 days.  Do not feel he needs further evaluation at this time.  Abdominal exam is benign.  Have low suspicion for acute intra-abdominal process.  After history, exam, and medical workup I feel the patient has been appropriately medically screened and is safe for discharge home. Pertinent diagnoses were discussed with the patient. Patient was given return precautions.     Final Clinical Impression(s) / ED Diagnoses Final diagnoses:  Dysuria  Concern about STD in male without diagnosis    Rx / DC Orders ED Discharge Orders         Ordered    doxycycline (VIBRAMYCIN) 100 MG capsule  2 times daily     01/07/20 0029           Aujanae Mccullum, Barbette Hair, MD 01/07/20 0040

## 2020-01-08 LAB — GC/CHLAMYDIA PROBE AMP (~~LOC~~) NOT AT ARMC
Chlamydia: NEGATIVE
Comment: NEGATIVE
Comment: NORMAL
Neisseria Gonorrhea: NEGATIVE

## 2020-03-29 ENCOUNTER — Other Ambulatory Visit: Payer: Self-pay

## 2020-03-29 DIAGNOSIS — Z5321 Procedure and treatment not carried out due to patient leaving prior to being seen by health care provider: Secondary | ICD-10-CM | POA: Insufficient documentation

## 2020-03-29 DIAGNOSIS — R202 Paresthesia of skin: Secondary | ICD-10-CM | POA: Insufficient documentation

## 2020-03-29 DIAGNOSIS — R350 Frequency of micturition: Secondary | ICD-10-CM | POA: Insufficient documentation

## 2020-03-29 NOTE — ED Triage Notes (Signed)
Pt reports frequent urination and penile tingling.

## 2020-03-30 ENCOUNTER — Emergency Department (HOSPITAL_BASED_OUTPATIENT_CLINIC_OR_DEPARTMENT_OTHER)
Admission: EM | Admit: 2020-03-30 | Discharge: 2020-03-30 | Disposition: A | Discharge status: Home / Self Care | Payer: Self-pay | Attending: Emergency Medicine | Admitting: Emergency Medicine

## 2020-03-30 ENCOUNTER — Encounter (HOSPITAL_BASED_OUTPATIENT_CLINIC_OR_DEPARTMENT_OTHER): Payer: Self-pay

## 2020-03-30 LAB — URINALYSIS, MICROSCOPIC (REFLEX)
Bacteria, UA: NONE SEEN
Squamous Epithelial / HPF: NONE SEEN (ref 0–5)
WBC, UA: NONE SEEN WBC/hpf (ref 0–5)

## 2020-03-30 LAB — URINALYSIS, ROUTINE W REFLEX MICROSCOPIC
Bilirubin Urine: NEGATIVE
Glucose, UA: NEGATIVE mg/dL
Ketones, ur: NEGATIVE mg/dL
Leukocytes,Ua: NEGATIVE
Nitrite: NEGATIVE
Protein, ur: NEGATIVE mg/dL
Specific Gravity, Urine: >1.03 — ABNORMAL HIGH (ref 1.005–1.030)
pH: 6 (ref 5.0–8.0)

## 2020-03-30 LAB — CBG MONITORING, ED: Glucose-Capillary: 113 mg/dL — ABNORMAL HIGH (ref 70–99)
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
## 2022-01-31 ENCOUNTER — Other Ambulatory Visit: Payer: Self-pay

## 2022-01-31 ENCOUNTER — Emergency Department (HOSPITAL_BASED_OUTPATIENT_CLINIC_OR_DEPARTMENT_OTHER)
Admission: EM | Admit: 2022-01-31 | Discharge: 2022-01-31 | Disposition: A | Discharge status: Home / Self Care | Payer: Medicaid Other | Attending: Emergency Medicine | Admitting: Emergency Medicine

## 2022-01-31 ENCOUNTER — Encounter (HOSPITAL_BASED_OUTPATIENT_CLINIC_OR_DEPARTMENT_OTHER): Payer: Self-pay | Admitting: Emergency Medicine

## 2022-01-31 DIAGNOSIS — L600 Ingrowing nail: Secondary | ICD-10-CM

## 2022-01-31 MED ORDER — CEPHALEXIN 500 MG PO CAPS: 500.0000 mg | ORAL_CAPSULE | Freq: Four times a day (QID) | ORAL | 0 refills | Status: DC

## 2022-01-31 MED ORDER — CEPHALEXIN 500 MG PO CAPS
500.0000 mg | ORAL_CAPSULE | Freq: Four times a day (QID) | ORAL | 0 refills | Status: DC
  Filled 2022-01-31: qty 28, 7d supply, fill #0

## 2022-01-31 MED ORDER — CEPHALEXIN 250 MG PO CAPS
500.0000 mg | ORAL_CAPSULE | Freq: Once | ORAL | Status: AC
  Administered 2022-01-31: 500 mg via ORAL
  Filled 2022-01-31: qty 2

## 2022-01-31 NOTE — ED Triage Notes (Signed)
R great toe edematous and oozing blood x 2 days. Pt states he thinks it started as an ingrown toenail.

## 2022-01-31 NOTE — Discharge Instructions (Addendum)
Begin taking Keflex as prescribed.  Soak your toe as much as possible.  Follow up with Podiatry on Monday as scheduled.

## 2022-01-31 NOTE — ED Provider Notes (Signed)
  [facility] EMERGENCY DEPARTMENT Provider Note   CSN: [id number] Arrival date & time: 01/31/22  0256     History  Chief Complaint  Patient presents with   Toe Injury    Neil Mccarty is a 26 y.o. male.  Patient is a 26 year old male with no significant past medical history.  He presents today with complaints of right great toe pain.  This has been worsening for several weeks, but now oozing blood over the past 2 days.  He was told at [facility] he had an ingrown toenail, then was referred to podiatry.  He has an appointment scheduled for tomorrow, but presents tonight over concerns of drainage.  He denies any fevers or chills.  The history is provided by the patient.      Home Medications Prior to Admission medications   Medication Sig Start Date End Date Taking? Authorizing Provider  doxycycline (VIBRAMYCIN) 100 MG capsule Take 1 capsule (100 mg total) by mouth 2 (two) times daily. 01/07/20   Horton, Mayer Masker, MD      Allergies    Shrimp (diagnostic)    Review of Systems   Review of Systems  All other systems reviewed and are negative.  Physical Exam Updated Vital Signs BP 134/85 (BP Location: Right Arm)   Pulse 67   Temp 98 F (36.7 C) (Oral)   Resp 20   Ht 5\' 11"  (1.803 m)   Wt 122.5 kg   SpO2 97%   BMI 37.66 kg/m  Physical Exam Vitals and nursing note reviewed.  Constitutional:      General: He is not in acute distress.    Appearance: Normal appearance. He is not ill-appearing.  HENT:     Head: Normocephalic and atraumatic.  Pulmonary:     Effort: Pulmonary effort is normal.  Skin:    General: Skin is warm and dry.     Comments: The right great toe has an ingrown section of nail at the lateral aspect.  There is surrounding erythema and mild bleeding.  Neurological:     Mental Status: He is alert.    ED Results / Procedures / Treatments   Labs (all labs ordered are listed, but only abnormal results are displayed) Labs  Reviewed - No data to display  EKG None  Radiology No results found.  Procedures Procedures    Medications Ordered in ED Medications  cephALEXin (KEFLEX) capsule 500 mg (has no administration in time range)    ED Course/ Medical Decision Making/ A&P  Patient presenting with an ingrown toenail.  He has an appointment with podiatry scheduled for tomorrow.  I will start antibiotics and defer nail removal to the podiatrist.  Final Clinical Impression(s) / ED Diagnoses Final diagnoses:  None    Rx / DC Orders ED Discharge Orders     None         , MD 01/31/22 [phone]

## 2022-02-01 ENCOUNTER — Other Ambulatory Visit (HOSPITAL_BASED_OUTPATIENT_CLINIC_OR_DEPARTMENT_OTHER): Payer: Self-pay

## 2022-05-20 ENCOUNTER — Encounter (HOSPITAL_BASED_OUTPATIENT_CLINIC_OR_DEPARTMENT_OTHER): Payer: Self-pay | Admitting: Emergency Medicine

## 2022-05-20 ENCOUNTER — Emergency Department (HOSPITAL_BASED_OUTPATIENT_CLINIC_OR_DEPARTMENT_OTHER)
Admission: EM | Admit: 2022-05-20 | Discharge: 2022-05-21 | Disposition: A | Discharge status: Home / Self Care | Payer: Medicaid Other | Attending: Emergency Medicine | Admitting: Emergency Medicine

## 2022-05-20 DIAGNOSIS — A64 Unspecified sexually transmitted disease: Secondary | ICD-10-CM | POA: Insufficient documentation

## 2022-05-20 DIAGNOSIS — J45909 Unspecified asthma, uncomplicated: Secondary | ICD-10-CM | POA: Insufficient documentation

## 2022-05-20 HISTORY — DX: Unspecified asthma, uncomplicated: J45.909

## 2022-05-20 NOTE — ED Triage Notes (Signed)
Pt reports burning sensation in penis. Denies urinary sx, discharge. Also requesting std testing.

## 2022-05-21 ENCOUNTER — Encounter (HOSPITAL_BASED_OUTPATIENT_CLINIC_OR_DEPARTMENT_OTHER): Payer: Self-pay | Admitting: Emergency Medicine

## 2022-05-21 LAB — URINALYSIS, MICROSCOPIC (REFLEX)

## 2022-05-21 LAB — URINALYSIS, ROUTINE W REFLEX MICROSCOPIC
Bilirubin Urine: NEGATIVE
Glucose, UA: NEGATIVE mg/dL
Ketones, ur: NEGATIVE mg/dL
Leukocytes,Ua: NEGATIVE
Nitrite: NEGATIVE
Protein, ur: NEGATIVE mg/dL
Specific Gravity, Urine: 1.025 (ref 1.005–1.030)
pH: 6 (ref 5.0–8.0)

## 2022-05-21 MED ORDER — DOXYCYCLINE HYCLATE 100 MG PO TABS
100.0000 mg | ORAL_TABLET | Freq: Once | ORAL | Status: AC
  Administered 2022-05-21: 100 mg via ORAL
  Filled 2022-05-21: qty 1

## 2022-05-21 MED ORDER — CEFTRIAXONE SODIUM 500 MG IJ SOLR
500.0000 mg | Freq: Once | INTRAMUSCULAR | Status: AC
  Administered 2022-05-21: 500 mg via INTRAMUSCULAR
  Filled 2022-05-21: qty 500

## 2022-05-21 MED ORDER — DOXYCYCLINE HYCLATE 100 MG PO CAPS: 100.0000 mg | ORAL_CAPSULE | Freq: Two times a day (BID) | ORAL | 0 refills | Status: DC

## 2022-05-21 NOTE — ED Provider Notes (Signed)
[facility] EMERGENCY DEPARTMENT Provider Note   CSN: [id number] Arrival date & time: 05/20/22  2341     History  Chief Complaint  Patient presents with   Male GU Problem    Neil Mccarty. Neil Mccarty is a 26 y.o. male.  The history is provided by the patient.  Male GU Problem Presenting symptoms comment:  Penile burning with concern for STI Context: spontaneously   Relieved by:  Nothing Worsened by:  Nothing Ineffective treatments:  None tried Associated symptoms: no fever, no genital itching, no penile redness, no penile swelling, no urinary incontinence, no urinary retention and no vomiting   Risk factors: unprotected sex   Patient presents with penile burning and concern for STI.  No known exposure but has had unprotected encounters.   Past Medical History:  Diagnosis Date   Asthma        Home Medications Prior to Admission medications   Medication Sig Start Date End Date Taking? Authorizing Provider  doxycycline (VIBRAMYCIN) 100 MG capsule Take 1 capsule (100 mg total) by mouth 2 (two) times daily. One po bid x 7 days 05/21/22  Yes Maricarmen Braziel, MD  cephALEXin (KEFLEX) 500 MG capsule Take 1 capsule (500 mg total) by mouth 4 (four) times daily. 01/31/22   Geoffery Lyons, MD  doxycycline (VIBRAMYCIN) 100 MG capsule Take 1 capsule (100 mg total) by mouth 2 (two) times daily. 01/07/20   Horton, Mayer Masker, MD      Allergies    Shrimp extract allergy skin test and Shrimp (diagnostic)    Review of Systems   Review of Systems  Constitutional:  Negative for fever.  HENT:  Negative for facial swelling.   Eyes:  Negative for redness.  Respiratory:  Negative for wheezing and stridor.   Gastrointestinal:  Negative for vomiting.  Genitourinary:  Negative for bladder incontinence, genital sores and penile swelling.  All other systems reviewed and are negative.   Physical Exam Updated Vital Signs BP (!) 156/94   Pulse 65   Temp 98.2 F (36.8 C) (Oral)   Resp 17    Ht 5\' 11"  (1.803 m)   Wt 117.9 kg   SpO2 98%   BMI 36.26 kg/m  Physical Exam Vitals and nursing note reviewed.  Constitutional:      General: He is not in acute distress.    Appearance: He is well-developed. He is not diaphoretic.  HENT:     Head: Normocephalic and atraumatic.  Eyes:     Conjunctiva/sclera: Conjunctivae normal.     Pupils: Pupils are equal, round, and reactive to light.  Cardiovascular:     Rate and Rhythm: Normal rate and regular rhythm.  Pulmonary:     Effort: Pulmonary effort is normal.     Breath sounds: Normal breath sounds. No wheezing or rales.  Abdominal:     General: Bowel sounds are normal.     Palpations: Abdomen is soft.     Tenderness: There is no abdominal tenderness. There is no guarding or rebound.  Musculoskeletal:        General: Normal range of motion.     Cervical back: Normal range of motion and neck supple.  Skin:    General: Skin is warm and dry.  Neurological:     Mental Status: He is alert and oriented to person, place, and time.     ED Results / Procedures / Treatments   Labs (all labs ordered are listed, but only abnormal results are displayed) Labs Reviewed  URINALYSIS, ROUTINE W REFLEX MICROSCOPIC - Abnormal; Notable for the following components:      Result Value   Hgb urine dipstick TRACE (*)    All other components within normal limits  URINALYSIS, MICROSCOPIC (REFLEX) - Abnormal; Notable for the following components:   Bacteria, UA RARE (*)    All other components within normal limits  GC/CHLAMYDIA PROBE AMP ([place]) NOT AT [facility]    EKG None  Radiology No results found.  Procedures Procedures    Medications Ordered in ED Medications  cefTRIAXone (ROCEPHIN) injection 500 mg (500 mg Intramuscular Given 05/21/22 0102)  doxycycline (VIBRA-TABS) tablet 100 mg (100 mg Oral Given 05/21/22 0101)    ED Course/ Medical Decision Making/ A&P                           Medical Decision Making Patient presents  with penile burning and concern for STI  Amount and/or Complexity of Data Reviewed External Data Reviewed: notes.    Details: Previous ED notes reviewed  Labs: ordered.    Details: All labs reviewed:  urine is not consistent with UTI.  GC and chlamydia sent   Risk Prescription drug management. Risk Details: Patient has been treated for STI in the ED.  He has been informed to notify all partners and that they must also be treated.  They can be treated at the [facility].  No sexual activity of any kind until 7 days after all partners have been treated.  This was communicated both verbally and in writing on discharge paperwork.  Patient verbalizes understanding of these instructions.     Final Clinical Impression(s) / ED Diagnoses Final diagnoses:  STI (sexually transmitted infection)   Return for intractable cough, coughing up blood, fevers > 100.4 unrelieved by medication, shortness of breath, intractable vomiting, chest pain, shortness of breath, weakness, numbness, changes in speech, facial asymmetry, abdominal pain, passing out, Inability to tolerate liquids or food, cough, altered mental status or any concerns. No signs of systemic illness or infection. The patient is nontoxic-appearing on exam and vital signs are within normal limits.  I have reviewed the triage vital signs and the nursing notes. Pertinent labs & imaging results that were available during my care of the patient were reviewed by me and considered in my medical decision making (see chart for details). After history, exam, and medical workup I feel the patient has been appropriately medically screened and is safe for discharge home. Pertinent diagnoses were discussed with the patient. Patient was given return precautions.  Rx / DC Orders ED Discharge Orders          Ordered    doxycycline (VIBRAMYCIN) 100 MG capsule  2 times daily        05/21/22 0049              Miabella Shannahan, MD 05/21/22 0127

## 2022-05-21 NOTE — Discharge Instructions (Signed)
Inform all partners.  No sexual activity until 7 days after all partners treated.  They can be treated at the [facility]

## 2022-05-22 LAB — GC/CHLAMYDIA PROBE AMP (~~LOC~~) NOT AT ARMC
Chlamydia: NEGATIVE
Comment: NEGATIVE
Comment: NORMAL
Neisseria Gonorrhea: NEGATIVE

## 2022-06-23 ENCOUNTER — Other Ambulatory Visit: Payer: Self-pay

## 2022-06-23 ENCOUNTER — Emergency Department (HOSPITAL_BASED_OUTPATIENT_CLINIC_OR_DEPARTMENT_OTHER)
Admission: EM | Admit: 2022-06-23 | Discharge: 2022-06-24 | Disposition: A | Discharge status: Home / Self Care | Payer: Medicaid Other | Attending: Emergency Medicine | Admitting: Emergency Medicine

## 2022-06-23 ENCOUNTER — Encounter (HOSPITAL_BASED_OUTPATIENT_CLINIC_OR_DEPARTMENT_OTHER): Payer: Self-pay | Admitting: Urology

## 2022-06-23 DIAGNOSIS — N50811 Right testicular pain: Secondary | ICD-10-CM | POA: Insufficient documentation

## 2022-06-23 LAB — URINALYSIS, ROUTINE W REFLEX MICROSCOPIC
Bilirubin Urine: NEGATIVE
Glucose, UA: NEGATIVE mg/dL
Ketones, ur: NEGATIVE mg/dL
Leukocytes,Ua: NEGATIVE
Nitrite: NEGATIVE
Protein, ur: NEGATIVE mg/dL
Specific Gravity, Urine: 1.02 (ref 1.005–1.030)
pH: 7 (ref 5.0–8.0)

## 2022-06-23 LAB — URINALYSIS, MICROSCOPIC (REFLEX): WBC, UA: NONE SEEN WBC/hpf (ref 0–5)

## 2022-06-23 MED ORDER — CEFTRIAXONE SODIUM 500 MG IJ SOLR
500.0000 mg | Freq: Once | INTRAMUSCULAR | Status: AC
  Administered 2022-06-24: 500 mg via INTRAMUSCULAR
  Filled 2022-06-23: qty 500

## 2022-06-23 MED ORDER — DOXYCYCLINE HYCLATE 100 MG PO TABS
100.0000 mg | ORAL_TABLET | Freq: Once | ORAL | Status: AC
  Administered 2022-06-24: 100 mg via ORAL
  Filled 2022-06-23: qty 1

## 2022-06-23 NOTE — ED Triage Notes (Signed)
Pt states "burning sensation in my right testicle" x 4-5 days  Denies burning after urination  Denies any penile discharge, recent STD tests all negative

## 2022-06-24 LAB — RAPID HIV SCREEN (HIV 1/2 AB+AG)
HIV 1/2 Antibodies: NONREACTIVE
HIV-1 P24 Antigen - HIV24: NONREACTIVE

## 2022-06-24 LAB — RPR: RPR Ser Ql: NONREACTIVE

## 2022-06-24 LAB — HIV ANTIBODY (ROUTINE TESTING W REFLEX): HIV Screen 4th Generation wRfx: NONREACTIVE

## 2022-06-24 MED ORDER — DOXYCYCLINE HYCLATE 100 MG PO CAPS: 100.0000 mg | ORAL_CAPSULE | Freq: Two times a day (BID) | ORAL | 0 refills | Status: AC

## 2022-06-24 NOTE — ED Provider Notes (Signed)
[facility] EMERGENCY DEPARTMENT Provider Note   CSN: [id number] Arrival date & time: 06/23/22  2132     History  Chief Complaint  Patient presents with   Testicle Pain    Neil Mccarty. Neil Mccarty is a 26 y.o. male.  26 yo M here with testicle burning similar to previous times he may have had an STD. Also has mild dysuria at the end of his stream. Tried pyridium without relief. Last sexual contact was a couple weeks ago and did not wear protection. No abdominal pain or back pain.    Testicle Pain       Home Medications Prior to Admission medications   Medication Sig Start Date End Date Taking? Authorizing Provider  doxycycline (VIBRAMYCIN) 100 MG capsule Take 1 capsule (100 mg total) by mouth 2 (two) times daily. One po bid x 7 days 06/24/22  Yes Taysia Rivere, Corene Cornea, MD      Allergies    Shrimp extract allergy skin test and Shrimp (diagnostic)    Review of Systems   Review of Systems  Genitourinary:  Positive for testicular pain.    Physical Exam Updated Vital Signs BP (!) 148/82 (BP Location: Left Arm)   Pulse 62   Temp 98.6 F (37 C)   Resp 18   Ht 5\' 11"  (1.803 m)   Wt 117.9 kg   SpO2 98%   BMI 36.26 kg/m  Physical Exam Vitals and nursing note reviewed.  Constitutional:      Appearance: He is well-developed.  HENT:     Head: Normocephalic and atraumatic.     Mouth/Throat:     Mouth: Mucous membranes are moist.     Pharynx: Oropharynx is clear.  Eyes:     Pupils: Pupils are equal, round, and reactive to light.  Cardiovascular:     Rate and Rhythm: Normal rate.  Pulmonary:     Effort: Pulmonary effort is normal. No respiratory distress.  Abdominal:     General: There is no distension.  Genitourinary:    Testes: Normal.     Comments: Testicular exam is normal. Normal cremasteric. Normal lie. No edema or erythema. No ttp. No tenderness with rotation of testicle.  Musculoskeletal:        General: Normal range of motion.     Cervical back:  Normal range of motion.  Neurological:     Mental Status: He is alert.     ED Results / Procedures / Treatments   Labs (all labs ordered are listed, but only abnormal results are displayed) Labs Reviewed  URINALYSIS, ROUTINE W REFLEX MICROSCOPIC - Abnormal; Notable for the following components:      Result Value   Hgb urine dipstick TRACE (*)    All other components within normal limits  URINALYSIS, MICROSCOPIC (REFLEX) - Abnormal; Notable for the following components:   Bacteria, UA RARE (*)    All other components within normal limits  HIV ANTIBODY (ROUTINE TESTING W REFLEX)  RAPID HIV SCREEN (HIV 1/2 AB+AG)  RPR  GC/CHLAMYDIA PROBE AMP ([place]) NOT AT [facility]    EKG None  Radiology No results found.  Procedures Procedures    Medications Ordered in ED Medications  cefTRIAXone (ROCEPHIN) injection 500 mg (has no administration in time range)  doxycycline (VIBRA-TABS) tablet 100 mg (has no administration in time range)    ED Course/ Medical Decision Making/ A&P  Medical Decision Making Amount and/or Complexity of Data Reviewed Labs: ordered.  Risk Prescription drug management.   Low likelihood for testicular torsion. No trauma. Bacteria and blood in urine, no other symptoms c/w nephrolithiasis. GC/Chlam test done, requesting preemptive treatment. Also consented to HIV/RPR testing.   Final Clinical Impression(s) / ED Diagnoses Final diagnoses:  Pain in right testicle    Rx / DC Orders ED Discharge Orders          Ordered    doxycycline (VIBRAMYCIN) 100 MG capsule  2 times daily        06/24/22 0008              Aziyah Provencal, Corene Cornea, MD 06/24/22 0011

## 2022-06-25 LAB — GC/CHLAMYDIA PROBE AMP (~~LOC~~) NOT AT ARMC
Chlamydia: NEGATIVE
Comment: NEGATIVE
Comment: NORMAL
Neisseria Gonorrhea: NEGATIVE

## 2022-09-02 ENCOUNTER — Other Ambulatory Visit: Payer: Self-pay

## 2022-09-02 ENCOUNTER — Emergency Department (HOSPITAL_BASED_OUTPATIENT_CLINIC_OR_DEPARTMENT_OTHER)
Admission: EM | Admit: 2022-09-02 | Discharge: 2022-09-02 | Disposition: A | Discharge status: Home / Self Care | Payer: Medicaid Other | Attending: Emergency Medicine | Admitting: Emergency Medicine

## 2022-09-02 ENCOUNTER — Encounter (HOSPITAL_BASED_OUTPATIENT_CLINIC_OR_DEPARTMENT_OTHER): Payer: Self-pay | Admitting: Emergency Medicine

## 2022-09-02 DIAGNOSIS — R300 Dysuria: Secondary | ICD-10-CM | POA: Diagnosis not present

## 2022-09-02 LAB — URINALYSIS, ROUTINE W REFLEX MICROSCOPIC
Bilirubin Urine: NEGATIVE
Glucose, UA: NEGATIVE mg/dL
Hgb urine dipstick: NEGATIVE
Ketones, ur: NEGATIVE mg/dL
Leukocytes,Ua: NEGATIVE
Nitrite: NEGATIVE
Protein, ur: 30 mg/dL — AB
Specific Gravity, Urine: >=1.03 (ref 1.005–1.030)
pH: 5.5 (ref 5.0–8.0)

## 2022-09-02 LAB — URINALYSIS, MICROSCOPIC (REFLEX): Squamous Epithelial / HPF: NONE SEEN /HPF (ref 0–5)

## 2022-09-02 LAB — HIV ANTIBODY (ROUTINE TESTING W REFLEX): HIV Screen 4th Generation wRfx: NONREACTIVE

## 2022-09-02 NOTE — ED Provider Notes (Signed)
[facility] EMERGENCY DEPARTMENT Provider Note   CSN: [id number] Arrival date & time: 09/02/22  4401     History  Chief Complaint  Patient presents with   Dysuria    Neil Mccarty is a 27 y.o. male.  HPI     27yo male presents with concern for 4 days of burning with urination.  Denies abdominal pain, flank pain, nausea, vomiting, fever.    No known STI exposures. No penile discharge. Reports sexually active using condoms.  Did take apple cider vinegar bath the other day.  Has had prior evaluations for dysuria in the past , GC/Chl negative over past few years of evaluation.  Home Medications Prior to Admission medications   Medication Sig Start Date End Date Taking? Authorizing Provider  doxycycline (VIBRAMYCIN) 100 MG capsule Take 1 capsule (100 mg total) by mouth 2 (two) times daily. One po bid x 7 days 06/24/22   Mesner, Corene Cornea, MD      Allergies    Shrimp extract allergy skin test and Shrimp (diagnostic)    Review of Systems   Review of Systems  Physical Exam Updated Vital Signs BP 129/66 (BP Location: Left Arm)   Pulse 68   Temp 97.6 F (36.4 C) (Oral)   Resp 16   Ht 5\' 11"  (1.803 m)   Wt 120.2 kg   SpO2 98%   BMI 36.96 kg/m  Physical Exam Vitals and nursing note reviewed.  Constitutional:      General: He is not in acute distress.    Appearance: Normal appearance. He is not ill-appearing, toxic-appearing or diaphoretic.  HENT:     Head: Normocephalic.  Eyes:     Conjunctiva/sclera: Conjunctivae normal.  Cardiovascular:     Rate and Rhythm: Normal rate and regular rhythm.     Pulses: Normal pulses.  Pulmonary:     Effort: Pulmonary effort is normal. No respiratory distress.  Abdominal:     General: There is no distension.     Palpations: Abdomen is soft.     Tenderness: There is no abdominal tenderness.  Musculoskeletal:        General: No deformity or signs of injury.     Cervical back: No rigidity.  Skin:    General: Skin  is warm and dry.     Coloration: Skin is not jaundiced or pale.  Neurological:     General: No focal deficit present.     Mental Status: He is alert and oriented to person, place, and time.     ED Results / Procedures / Treatments   Labs (all labs ordered are listed, but only abnormal results are displayed) Labs Reviewed  URINALYSIS, ROUTINE W REFLEX MICROSCOPIC - Abnormal; Notable for the following components:      Result Value   Protein, ur 30 (*)    All other components within normal limits  URINALYSIS, MICROSCOPIC (REFLEX) - Abnormal; Notable for the following components:   Bacteria, UA RARE (*)    All other components within normal limits  HIV ANTIBODY (ROUTINE TESTING W REFLEX)  GC/CHLAMYDIA PROBE AMP ([place]) NOT AT [facility]    EKG None  Radiology No results found.  Procedures Procedures    Medications Ordered in ED Medications - No data to display  ED Course/ Medical Decision Making/ A&P                            27yo male presents with concern for  4 days of burning with urination.  No sign of UTI.  Declines empiric STI treatment.  Did have recent apple cider vinegar bath and question chemical urethritis.  Recommend continued supportive care, follow up gc/chl testing. Patient discharged in stable condition with understanding of reasons to return.         Final Clinical Impression(s) / ED Diagnoses Final diagnoses:  Dysuria    Rx / DC Orders ED Discharge Orders     None         Gareth Morgan, MD 09/02/22 2306

## 2022-09-02 NOTE — ED Triage Notes (Signed)
Dysuria x 4 days.  Some "tingling" with urination.  No fever.

## 2022-09-03 LAB — GC/CHLAMYDIA PROBE AMP (~~LOC~~) NOT AT ARMC
Chlamydia: NEGATIVE
Comment: NEGATIVE
Comment: NORMAL
Neisseria Gonorrhea: NEGATIVE

## 2022-09-12 ENCOUNTER — Emergency Department (HOSPITAL_BASED_OUTPATIENT_CLINIC_OR_DEPARTMENT_OTHER)
Admission: EM | Admit: 2022-09-12 | Discharge: 2022-09-13 | Disposition: A | Discharge status: Home / Self Care | Payer: Medicaid Other | Attending: Emergency Medicine | Admitting: Emergency Medicine

## 2022-09-12 ENCOUNTER — Other Ambulatory Visit: Payer: Self-pay

## 2022-09-12 DIAGNOSIS — R21 Rash and other nonspecific skin eruption: Secondary | ICD-10-CM | POA: Diagnosis present

## 2022-09-12 DIAGNOSIS — L731 Pseudofolliculitis barbae: Secondary | ICD-10-CM | POA: Diagnosis not present

## 2022-09-12 NOTE — ED Triage Notes (Signed)
Pt POV c/o BLLE painful rash x2 days.

## 2022-09-13 MED ORDER — IBUPROFEN 800 MG PO TABS
800.0000 mg | ORAL_TABLET | Freq: Once | ORAL | Status: AC
  Administered 2022-09-13: 800 mg via ORAL
  Filled 2022-09-13: qty 1

## 2022-09-13 NOTE — ED Provider Notes (Signed)
[name]   CSN: [id number] Arrival date & time: 09/12/22  2319     History  Chief Complaint  Patient presents with   Rash    Neil Mccarty is a 27 y.o. male.  The history is provided by the patient.  Rash Location: left shin. Severity:  Mild Timing:  Constant Progression:  Waxing and waning Chronicity:  Chronic Context: not food   Relieved by:  Nothing Worsened by:  Nothing Associated symptoms: no fever   Patient has a h/o ingrown hairs and was told to use dial soap but he does not believe this is helping him.       Home Medications Prior to Admission medications   Medication Sig Start Date End Date Taking? Authorizing Provider  doxycycline (VIBRAMYCIN) 100 MG capsule Take 1 capsule (100 mg total) by mouth 2 (two) times daily. One po bid x 7 days 06/24/22   Mesner, Corene Cornea, MD      Allergies    Shrimp extract allergy skin test and Shrimp (diagnostic)    Review of Systems   Review of Systems  Constitutional:  Negative for fever.  HENT:  Negative for facial swelling.   Eyes:  Negative for redness.  Skin:  Positive for rash.  All other systems reviewed and are negative.   Physical Exam Updated Vital Signs BP (!) 140/76   Pulse 67   Temp 98.3 F (36.8 C) (Oral)   Resp 15   Ht 5\' 11"  (1.803 m)   Wt 117.9 kg   SpO2 98%   BMI 36.26 kg/m  Physical Exam Vitals and nursing note reviewed. Exam conducted with a chaperone present.  Constitutional:      General: He is not in acute distress.    Appearance: Normal appearance. He is well-developed. He is not diaphoretic.  HENT:     Head: Normocephalic and atraumatic.     Nose: Nose normal.  Eyes:     Conjunctiva/sclera: Conjunctivae normal.     Pupils: Pupils are equal, round, and reactive to light.  Cardiovascular:     Rate and Rhythm: Normal rate and regular rhythm.  Pulmonary:     Effort: Pulmonary effort is normal.     Breath sounds: Normal breath  sounds. No wheezing or rales.  Abdominal:     General: Bowel sounds are normal.     Palpations: Abdomen is soft.     Tenderness: There is no abdominal tenderness. There is no guarding or rebound.  Musculoskeletal:        General: Normal range of motion.     Cervical back: Normal range of motion and neck supple.  Skin:    General: Skin is warm and dry.     Capillary Refill: Capillary refill takes less than 2 seconds.       Neurological:     Mental Status: He is alert and oriented to person, place, and time.     ED Results / Procedures / Treatments   Labs (all labs ordered are listed, but only abnormal results are displayed) Labs Reviewed - No data to display  EKG None  Radiology No results found.  Procedures Procedures    Medications Ordered in ED Medications  ibuprofen (ADVIL) tablet 800 mg (has no administration in time range)    ED Course/ Medical Decision Making/ A&P  Medical Decision Making Patient with ingrown hairs of the left shin   Amount and/or Complexity of Data Reviewed External Data Reviewed: notes.    Details: Previous notes reviewed   Risk Prescription drug management. Risk Details: No signs of infection. Patient is well appearing.  Recommend gentle exfoliation with a clean loofa and a mild liquid soap like cetaphil.  Stable for discharge with close follow up      Final Clinical Impression(s) / ED Diagnoses Final diagnoses:  Ingrown hair  Return for intractable cough, coughing up blood, fevers > 100.4 unrelieved by medication, shortness of breath, intractable vomiting, chest pain, shortness of breath, weakness, numbness, changes in speech, facial asymmetry, abdominal pain, passing out, Inability to tolerate liquids or food, cough, altered mental status or any concerns. No signs of systemic illness or infection. The patient is nontoxic-appearing on exam and vital signs are within normal limits.  I have reviewed the  triage vital signs and the nursing notes. Pertinent labs & imaging results that were available during my care of the patient were reviewed by me and considered in my medical decision making (see chart for details). After history, exam, and medical workup I feel the patient has been appropriately medically screened and is safe for discharge home. Pertinent diagnoses were discussed with the patient. Patient was given return precautions.   Rx / DC Orders ED Discharge Orders     None         Yannis Gumbs, MD 09/13/22 [id number]

## 2022-09-13 NOTE — ED Notes (Signed)
RN accompanied provider in assessing pt, he has several inflamed hair follicles.  Provider instructed pt on how to care for "ingrown hairs."

## 2022-09-30 ENCOUNTER — Emergency Department (HOSPITAL_BASED_OUTPATIENT_CLINIC_OR_DEPARTMENT_OTHER)
Admission: EM | Admit: 2022-09-30 | Discharge: 2022-09-30 | Disposition: A | Discharge status: Home / Self Care | Payer: Medicaid Other | Attending: Emergency Medicine | Admitting: Emergency Medicine

## 2022-09-30 ENCOUNTER — Encounter (HOSPITAL_BASED_OUTPATIENT_CLINIC_OR_DEPARTMENT_OTHER): Payer: Self-pay | Admitting: Urology

## 2022-09-30 ENCOUNTER — Other Ambulatory Visit: Payer: Self-pay

## 2022-09-30 DIAGNOSIS — S61012A Laceration without foreign body of left thumb without damage to nail, initial encounter: Secondary | ICD-10-CM | POA: Insufficient documentation

## 2022-09-30 DIAGNOSIS — Y9389 Activity, other specified: Secondary | ICD-10-CM | POA: Insufficient documentation

## 2022-09-30 DIAGNOSIS — W260XXA Contact with knife, initial encounter: Secondary | ICD-10-CM | POA: Insufficient documentation

## 2022-09-30 NOTE — ED Provider Notes (Signed)
   [facility]  Provider Note  CSN: [id number] Arrival date & time: 09/30/22 0024  History Chief Complaint  Patient presents with   Laceration    Neil Mccarty is a 27 y.o. male here with girlfriend for evaluation of laceration on L thumb while cutting a mango earlier tonight. TDAP is UTD.    Home Medications Prior to Admission medications   Medication Sig Start Date End Date Taking? Authorizing Provider  doxycycline (VIBRAMYCIN) 100 MG capsule Take 1 capsule (100 mg total) by mouth 2 (two) times daily. One po bid x 7 days 06/24/22   Mesner, Corene Cornea, MD     Allergies    Shrimp extract allergy skin test and Shrimp (diagnostic)   Review of Systems   Review of Systems Please see HPI for pertinent positives and negatives  Physical Exam BP (!) 161/141 (BP Location: Right Arm)   Pulse 64   Temp 98.3 F (36.8 C)   Resp 16   Ht 5\' 11"  (1.803 m)   Wt 117.9 kg   SpO2 97%   BMI 36.25 kg/m   Physical Exam Vitals and nursing note reviewed.  HENT:     Head: Normocephalic.     Nose: Nose normal.  Eyes:     Extraocular Movements: Extraocular movements intact.  Pulmonary:     Effort: Pulmonary effort is normal.  Musculoskeletal:        General: Normal range of motion.     Cervical back: Neck supple.  Skin:    Findings: No rash (on exposed skin).     Comments: 2.5cm superficial laceration to ulnar margin of distal L thumb, does not include nailbed  Neurological:     Mental Status: He is alert and oriented to person, place, and time.  Psychiatric:        Mood and Affect: Mood normal.     ED Results / Procedures / Treatments   EKG None  Procedures .Marland KitchenLaceration Repair  Date/Time: 09/30/2022 1:28 AM  Performed by: Truddie Hidden, MD Authorized by: Truddie Hidden, MD   Consent:    Consent obtained:  Verbal   Consent given by:  Patient Anesthesia:    Anesthesia method:  None Laceration details:    Location:   Finger   Finger location:  L thumb   Length (cm):  2.5 Treatment:    Area cleansed with:  Povidone-iodine and saline Skin repair:    Repair method:  Tissue adhesive Approximation:    Approximation:  Close Repair type:    Repair type:  Simple Post-procedure details:    Dressing:  Non-adherent dressing   Procedure completion:  Tolerated well, no immediate complications   Medications Ordered in the ED Medications - No data to display  Initial Impression and Plan  Patient here with superficial wound, cleaned and repaired as above. Given standard wound care advice.    ED Course       MDM Rules/Calculators/A&P Medical Decision Making Problems Addressed: Laceration of left thumb without foreign body without damage to nail, initial encounter: acute illness or injury     Final Clinical Impression(s) / ED Diagnoses Final diagnoses:  Laceration of left thumb without foreign body without damage to nail, initial encounter    Rx / DC Orders ED Discharge Orders     None        Truddie Hidden, MD 09/30/22 [phone]

## 2022-09-30 NOTE — ED Triage Notes (Signed)
Well approximatd left thumb lac to distal end medial.   Bleeding controlled, stated was cutting mango with kitchen knife  TDAP up to date

## 2022-10-08 ENCOUNTER — Encounter (HOSPITAL_BASED_OUTPATIENT_CLINIC_OR_DEPARTMENT_OTHER): Payer: Self-pay | Admitting: Emergency Medicine

## 2022-10-08 ENCOUNTER — Emergency Department (HOSPITAL_BASED_OUTPATIENT_CLINIC_OR_DEPARTMENT_OTHER): Payer: Medicaid Other

## 2022-10-08 ENCOUNTER — Emergency Department (HOSPITAL_BASED_OUTPATIENT_CLINIC_OR_DEPARTMENT_OTHER)
Admission: EM | Admit: 2022-10-08 | Discharge: 2022-10-08 | Disposition: A | Discharge status: Home / Self Care | Payer: Medicaid Other | Attending: Emergency Medicine | Admitting: Emergency Medicine

## 2022-10-08 DIAGNOSIS — R0602 Shortness of breath: Secondary | ICD-10-CM | POA: Diagnosis not present

## 2022-10-08 DIAGNOSIS — R42 Dizziness and giddiness: Secondary | ICD-10-CM | POA: Insufficient documentation

## 2022-10-08 DIAGNOSIS — R531 Weakness: Secondary | ICD-10-CM

## 2022-10-08 DIAGNOSIS — J45909 Unspecified asthma, uncomplicated: Secondary | ICD-10-CM | POA: Insufficient documentation

## 2022-10-08 DIAGNOSIS — Z20822 Contact with and (suspected) exposure to covid-19: Secondary | ICD-10-CM | POA: Insufficient documentation

## 2022-10-08 LAB — URINALYSIS, ROUTINE W REFLEX MICROSCOPIC
Bilirubin Urine: NEGATIVE
Glucose, UA: NEGATIVE mg/dL
Hgb urine dipstick: NEGATIVE
Ketones, ur: NEGATIVE mg/dL
Leukocytes,Ua: NEGATIVE
Nitrite: NEGATIVE
Protein, ur: NEGATIVE mg/dL
Specific Gravity, Urine: >=1.03 (ref 1.005–1.030)
pH: 5.5 (ref 5.0–8.0)

## 2022-10-08 LAB — CBC WITH DIFFERENTIAL/PLATELET
Abs Immature Granulocytes: 0.02 10*3/uL (ref 0.00–0.07)
Basophils Absolute: 0 10*3/uL (ref 0.0–0.1)
Basophils Relative: 1 %
Eosinophils Absolute: 0.2 10*3/uL (ref 0.0–0.5)
Eosinophils Relative: 3 %
HCT: 39.9 % (ref 39.0–52.0)
Hemoglobin: 13.3 g/dL (ref 13.0–17.0)
Immature Granulocytes: 0 %
Lymphocytes Relative: 46 %
Lymphs Abs: 3.6 10*3/uL (ref 0.7–4.0)
MCH: 31.3 pg (ref 26.0–34.0)
MCHC: 33.3 g/dL (ref 30.0–36.0)
MCV: 93.9 fL (ref 80.0–100.0)
Monocytes Absolute: 0.8 10*3/uL (ref 0.1–1.0)
Monocytes Relative: 10 %
Neutro Abs: 3.1 10*3/uL (ref 1.7–7.7)
Neutrophils Relative %: 40 %
Platelets: 263 10*3/uL (ref 150–400)
RBC: 4.25 MIL/uL (ref 4.22–5.81)
RDW: 12.3 % (ref 11.5–15.5)
WBC: 7.8 10*3/uL (ref 4.0–10.5)
nRBC: 0 % (ref 0.0–0.2)

## 2022-10-08 LAB — COMPREHENSIVE METABOLIC PANEL
ALT: 27 U/L (ref 0–44)
AST: 24 U/L (ref 15–41)
Albumin: 4.3 g/dL (ref 3.5–5.0)
Alkaline Phosphatase: 70 U/L (ref 38–126)
Anion gap: 6 (ref 5–15)
BUN: 9 mg/dL (ref 6–20)
CO2: 26 mmol/L (ref 22–32)
Calcium: 8.7 mg/dL — ABNORMAL LOW (ref 8.9–10.3)
Chloride: 103 mmol/L (ref 98–111)
Creatinine, Ser: 0.87 mg/dL (ref 0.61–1.24)
GFR, Estimated: >60 mL/min (ref >60)
Glucose, Bld: 141 mg/dL — ABNORMAL HIGH (ref 70–99)
Potassium: 3.4 mmol/L — ABNORMAL LOW (ref 3.5–5.1)
Sodium: 135 mmol/L (ref 135–145)
Total Bilirubin: 0.8 mg/dL (ref 0.3–1.2)
Total Protein: 7.8 g/dL (ref 6.5–8.1)

## 2022-10-08 LAB — RAPID URINE DRUG SCREEN, HOSP PERFORMED
Amphetamines: NOT DETECTED
Barbiturates: NOT DETECTED
Benzodiazepines: NOT DETECTED
Cocaine: NOT DETECTED
Opiates: NOT DETECTED
Tetrahydrocannabinol: NOT DETECTED

## 2022-10-08 LAB — TROPONIN I (HIGH SENSITIVITY)
Troponin I (High Sensitivity): 4 ng/L (ref <18)
Troponin I (High Sensitivity): 3 ng/L (ref <18)

## 2022-10-08 LAB — RESP PANEL BY RT-PCR (RSV, FLU A&B, COVID)  RVPGX2
Influenza A by PCR: NEGATIVE
Influenza B by PCR: NEGATIVE
Resp Syncytial Virus by PCR: NEGATIVE
SARS Coronavirus 2 by RT PCR: NEGATIVE

## 2022-10-08 LAB — MAGNESIUM: Magnesium: 1.8 mg/dL (ref 1.7–2.4)

## 2022-10-08 LAB — D-DIMER, QUANTITATIVE: D-Dimer, Quant: <0.27 ug/mL-FEU (ref 0.00–0.50)

## 2022-10-08 MED ORDER — IOHEXOL 350 MG/ML SOLN
100.0000 mL | Freq: Once | INTRAVENOUS | Status: AC | PRN
  Administered 2022-10-08: 100 mL via INTRAVENOUS

## 2022-10-08 MED ORDER — SODIUM CHLORIDE 0.9 % IV BOLUS
1000.0000 mL | Freq: Once | INTRAVENOUS | Status: AC
  Administered 2022-10-08: 1000 mL via INTRAVENOUS

## 2022-10-08 MED ORDER — LACTATED RINGERS IV BOLUS
1000.0000 mL | Freq: Once | INTRAVENOUS | Status: AC
  Administered 2022-10-08: 1000 mL via INTRAVENOUS

## 2022-10-08 MED ORDER — MECLIZINE HCL 25 MG PO TABS
25.0000 mg | ORAL_TABLET | Freq: Once | ORAL | Status: AC
  Administered 2022-10-08: 25 mg via ORAL
  Filled 2022-10-08: qty 1

## 2022-10-08 MED ORDER — KETOROLAC TROMETHAMINE 15 MG/ML IJ SOLN
15.0000 mg | Freq: Once | INTRAMUSCULAR | Status: AC
  Administered 2022-10-08: 15 mg via INTRAVENOUS
  Filled 2022-10-08: qty 1

## 2022-10-08 NOTE — ED Notes (Signed)
ED Provider at bedside. 

## 2022-10-08 NOTE — ED Notes (Signed)
Pt asked to provide urine sample again.

## 2022-10-08 NOTE — ED Notes (Signed)
IV Removed  

## 2022-10-08 NOTE — Discharge Instructions (Signed)
Your workup was reassuring.  Potentially a virus of some kind that could be causing some of the symptoms.  Your potassium was just slightly below normal.  Follow-up with your doctor as needed.

## 2022-10-08 NOTE — ED Notes (Signed)
Ambulated in hall, denies dizziness but states his head started pounding when he got up. ED MD informed

## 2022-10-08 NOTE — ED Provider Notes (Addendum)
  Physical Exam  BP (!) 115/56   Pulse 74   Temp 98.5 F (36.9 C) (Oral)   Resp (!) 22   SpO2 93%   Physical Exam  Procedures  Procedures  ED Course / MDM    Medical Decision Making Amount and/or Complexity of Data Reviewed Labs: ordered. Radiology: ordered.  Risk Prescription drug management.   Received patient in signout.  Dizziness initially lightheadedness.  Reportedly woke up and have difficulty moving.  Had been feeling better somewhat.  Orthostatic.  Given fluids but when attempted to ambulate felt lightheaded.  Patient both states it was like he was going to pass out but now says he feels as if the room was spinning.  Feels as if he is able to move all his extremities.  No headache.  No ringing in his ears.  Has not had episodes like this before.  Will give more fluids and some Antivert.  Patient states now he has a headache.  States he is still feeling weak.  States his legs hurt and are weak.  States he would not be able to walk to the car because he feels bad.  Will give some Toradol.  Has had recent head CT today and do not think we need to repeat it.  Will also give some lactated Ringer's since he said the saline gave him headache.  More fluid given.  Headache improved some.  Negative flu COVID RSV testing.  Workup reassuring.  Strength does appear to be present in his legs.  Follow-up as an outpatient as needed.       Davonna Belling, MD 10/08/22 [id number]    Davonna Belling, MD 10/08/22 1023

## 2022-10-08 NOTE — ED Provider Notes (Signed)
Neil Mccarty Arrival date & time: 10/08/22  [id number]     History  Chief Complaint  Patient presents with   Dizziness    Neil Mccarty is a 27 y.o. male.  The history is provided by the patient and medical records.  Dizziness Neil Abraham. Danby is a 27 y.o. male who presents to the Emergency Department complaining of weakness.  Neil Mccarty presents to the emergency department accompanied by his significant other for evaluation of shortness of breath and weakness.  Neil Mccarty had eaten a frozen pizza from [employer] and fell asleep for a short period of time and when Neil Mccarty woke up Neil Mccarty felt tingling all over his body and felt like Neil Mccarty was going to pass out with associated shortness of breath.  Neil Mccarty does have a history of asthma.  No recent illnesses.  No fever, chest pain, neck pain, headache, abdominal pain, nausea, vomiting, diarrhea.  Neil Mccarty has a family history of diabetes.  No tobacco, alcohol, drugs.   Neil Mccarty does report stress in his life but does not want to provide additional details regarding what it is.     Home Medications Prior to Admission medications   Medication Sig Start Date End Date Taking? Authorizing Provider  doxycycline (VIBRAMYCIN) 100 MG capsule Take 1 capsule (100 mg total) by mouth 2 (two) times daily. One po bid x 7 days 06/24/22   Mesner, Corene Cornea, MD      Allergies    Shrimp extract allergy skin test and Shrimp (diagnostic)    Review of Systems   Review of Systems  Neurological:  Positive for dizziness.  All other systems reviewed and are negative.   Physical Exam Updated Vital Signs BP (!) 118/56   Pulse 74   Temp 98.5 F (36.9 C) (Oral)   Resp 20   SpO2 93%  Physical Exam Vitals and nursing note reviewed.  Constitutional:      Appearance: Neil Mccarty is well-developed.  HENT:     Head: Normocephalic and atraumatic.  Cardiovascular:     Rate and Rhythm: Normal rate and regular rhythm.     Heart  sounds: No murmur heard. Pulmonary:     Effort: Pulmonary effort is normal. No respiratory distress.     Breath sounds: Normal breath sounds.  Abdominal:     Palpations: Abdomen is soft.     Tenderness: There is no abdominal tenderness. There is no guarding or rebound.  Musculoskeletal:        General: No swelling or tenderness.  Skin:    General: Skin is warm and dry.  Neurological:     Mental Status: Neil Mccarty is alert and oriented to person, place, and time.     Comments: No asymmetry of facial movements.  5 out of 5 strength in all 4 extremities with sensation to light touch intact in all 4 extremities.  No ankle clonus bilaterally.  Downgoing toes.  Psychiatric:        Behavior: Behavior normal.     ED Results / Procedures / Treatments   Labs (all labs ordered are listed, but only abnormal results are displayed) Labs Reviewed  COMPREHENSIVE METABOLIC PANEL - Abnormal; Notable for the following components:      Result Value   Potassium 3.4 (*)    Glucose, Bld 141 (*)    Calcium 8.7 (*)    All other components within normal limits  CBC WITH DIFFERENTIAL/PLATELET  URINALYSIS, ROUTINE W REFLEX  MICROSCOPIC  MAGNESIUM  RAPID URINE DRUG SCREEN, HOSP PERFORMED  D-DIMER, QUANTITATIVE  CBG MONITORING, ED  TROPONIN I (HIGH SENSITIVITY)  TROPONIN I (HIGH SENSITIVITY)    EKG EKG Interpretation  Date/Time:  Friday October 08 2022 03:02:27 EST Ventricular Rate:  78 PR Interval:  173 QRS Duration: 102 QT Interval:  367 QTC Calculation: 418 R Axis:   108 Text Interpretation: Sinus rhythm st evlevation c/w early repol No previous ECGs available Confirmed by Quintella Reichert [phone]) on 10/08/2022 3:40:32 AM  Radiology CT Angio Chest PE W/Cm &/Or Wo Cm  Result Date: 10/08/2022 CLINICAL DATA:  27 year old male history of shortness of breath. EXAM: CT ANGIOGRAPHY CHEST WITH CONTRAST TECHNIQUE: Multidetector CT imaging of the chest was performed using the standard protocol during bolus  administration of intravenous contrast. Multiplanar CT image reconstructions and MIPs were obtained to evaluate the vascular anatomy. RADIATION DOSE REDUCTION: This exam was performed according to the departmental dose-optimization program which includes automated exposure control, adjustment of the mA and/or kV according to patient size and/or use of iterative reconstruction technique. CONTRAST:  148m OMNIPAQUE IOHEXOL 350 MG/ML SOLN COMPARISON:  No priors. FINDINGS: Comment: Per report from the technologist, the the patient's IV infiltrated. This resulted in a nondiagnostic study for assessment of pulmonary embolism. Cardiovascular: Accurate assessment for filling defects within the pulmonary arterial tree is not possible on today's examination which is essentially a noncontrast study. Heart size is borderline enlarged. There is no significant pericardial fluid, thickening or pericardial calcification. No atherosclerotic calcifications are noted in the thoracic aorta or the coronary arteries. Mediastinum/Nodes: No pathologically enlarged mediastinal or hilar lymph nodes. Please note that accurate exclusion of hilar adenopathy is limited on noncontrast CT scans. Esophagus is unremarkable in appearance. No axillary lymphadenopathy. Lungs/Pleura: Patchy areas of dependent subsegmental atelectasis are noted in the lungs. No acute consolidative airspace disease. No pleural effusions. No definite suspicious appearing pulmonary nodules or masses are noted. Upper Abdomen: Unremarkable. Musculoskeletal: There are no aggressive appearing lytic or blastic lesions noted in the visualized portions of the skeleton. Review of the MIP images confirms the above findings. IMPRESSION: 1. Limited examination which is nondiagnostic for assessment of pulmonary embolism. No acute findings are noted in the thorax to account for the patient's symptoms. Electronically Signed   By: DVinnie LangtonM.D.   On: 10/08/2022 05:42   CT Head  Wo Contrast  Result Date: 10/08/2022 CLINICAL DATA:  27year old male with history of altered mental status. EXAM: CT HEAD WITHOUT CONTRAST TECHNIQUE: Contiguous axial images were obtained from the base of the skull through the vertex without intravenous contrast. RADIATION DOSE REDUCTION: This exam was performed according to the departmental dose-optimization program which includes automated exposure control, adjustment of the mA and/or kV according to patient size and/or use of iterative reconstruction technique. COMPARISON:  No priors. FINDINGS: Brain: No evidence of acute infarction, hemorrhage, hydrocephalus, extra-axial collection or mass lesion/mass effect. Vascular: No hyperdense vessel or unexpected calcification. Skull: Normal. Negative for fracture or focal lesion. Sinuses/Orbits: No acute finding. Other: None. IMPRESSION: 1. No acute intracranial abnormalities. The appearance of the brain is normal. Electronically Signed   By: DVinnie LangtonM.D.   On: 10/08/2022 05:29   DG Chest 2 View  Result Date: 10/08/2022 CLINICAL DATA:  Shortness of breath. EXAM: CHEST - 2 VIEW COMPARISON:  PA Lat 07/17/2019 FINDINGS: There is a low inspiration on exam. There is increased opacity in the lower lung fields on the PA view, but this is not seen on  the better-inflated lateral view. This is most likely hypoventilatory change due to low inspiration, less likely early pneumonias as is not reproduced on the lateral. The upper lung fields are clear. The cardiomediastinal silhouette and vasculature are normal. The thoracic cage is intact. IMPRESSION: Increased opacity in the lower lung fields on the PA view, most likely hypoventilatory change due to low inspiration, less likely bibasilar pneumonia. Electronically Signed   By: Telford Nab M.D.   On: 10/08/2022 04:19    Procedures Procedures    Medications Ordered in ED Medications  sodium chloride 0.9 % bolus 1,000 mL (has no administration in time range)   lactated ringers bolus 1,000 mL (0 mLs Intravenous Stopped 10/08/22 0555)  iohexol (OMNIPAQUE) 350 MG/ML injection 100 mL (100 mLs Intravenous Contrast Given 10/08/22 0504)    ED Course/ Medical Decision Making/ A&P                             Medical Decision Making Amount and/or Complexity of Data Reviewed Labs: ordered. Radiology: ordered.  Risk Prescription drug management.   Patient here for evaluation of generalized weakness, shortness of breath.  Neil Mccarty was previously healthy about an hour prior to ED arrival.  On evaluation Neil Mccarty is nontoxic-appearing with no respiratory distress.  Neil Mccarty has no focal neurologic deficits.  Neil Mccarty does appear fatigued.  No reports of drug ingestion but discussed with patient it is possible that Neil Mccarty inadvertently ingested a medication or drug that belonged to someone else, therefore a UDS was obtained-this is negative for acute abnormality.  Labs with mild hypokalemia otherwise no significant abnormality.  Given his extensive subjective weakness a CT head was obtained, which is negative for acute abnormality.  Given his shortness of breath concern for possible PE and initial chest x-ray was limited could not rule out pneumonia.  Attempts at CTA was unsuccessful as patient did have IV contrast extravasation.  A D-dimer was added onto his prior labs, which returned negative.  Decision made to not readminister IV contrast as Neil Mccarty is low risk for PE.  CT scan is negative for pneumonia.  Current clinical picture is not consistent with ACS, arrhythmia, acute infection.  Doubt Guillain-Barr, reflexes intact.  Neil Mccarty is orthostatic in the emergency department.  Plan to treat with IV fluids.  Care transferred pending IV fluids, ambulatory trial and repeat troponin.        Final Clinical Impression(s) / ED Diagnoses Final diagnoses:  Shortness of breath    Rx / DC Orders ED Discharge Orders     None         Quintella Reichert, MD 10/08/22 [phone]

## 2022-10-08 NOTE — ED Triage Notes (Addendum)
PT was eating pizza he fixed at home and woke up unable to move. Denies ingestion of any substance. Complaints of throat being dry and having difficulty swallowing. No signs of edema to airway and no difficulty breathing or moving air.

## 2024-08-14 ENCOUNTER — Other Ambulatory Visit (HOSPITAL_COMMUNITY): Payer: Self-pay
